# Patient Record
Sex: Female | Born: 1945 | Race: Black or African American | Hispanic: No | Marital: Married | Smoking: Former smoker
Health system: Southern US, Community
[De-identification: ages and names within clinical notes are randomized; demographics above are authoritative.]

## PROBLEM LIST (undated history)

## (undated) DIAGNOSIS — E7800 Pure hypercholesterolemia, unspecified: Secondary | ICD-10-CM

## (undated) DIAGNOSIS — Z972 Presence of dental prosthetic device (complete) (partial): Secondary | ICD-10-CM

## (undated) DIAGNOSIS — K219 Gastro-esophageal reflux disease without esophagitis: Secondary | ICD-10-CM

## (undated) DIAGNOSIS — F419 Anxiety disorder, unspecified: Secondary | ICD-10-CM

## (undated) DIAGNOSIS — D61818 Other pancytopenia: Secondary | ICD-10-CM

## (undated) DIAGNOSIS — M810 Age-related osteoporosis without current pathological fracture: Secondary | ICD-10-CM

## (undated) DIAGNOSIS — D649 Anemia, unspecified: Secondary | ICD-10-CM

## (undated) DIAGNOSIS — I10 Essential (primary) hypertension: Secondary | ICD-10-CM

## (undated) DIAGNOSIS — R011 Cardiac murmur, unspecified: Secondary | ICD-10-CM

## (undated) DIAGNOSIS — C8590 Non-Hodgkin lymphoma, unspecified, unspecified site: Secondary | ICD-10-CM

## (undated) HISTORY — PX: COLONOSCOPY: SHX174

## (undated) HISTORY — PX: FOOT SURGERY: SHX648

## (undated) HISTORY — DX: Other pancytopenia: D61.818

## (undated) HISTORY — PX: ABDOMINAL HYSTERECTOMY: SHX81

---
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
## 2004-06-16 ENCOUNTER — Other Ambulatory Visit: Payer: Self-pay

## 2004-08-07 ENCOUNTER — Emergency Department: Payer: Self-pay | Admitting: Internal Medicine

## 2004-10-15 ENCOUNTER — Ambulatory Visit: Payer: Self-pay | Admitting: Unknown Physician Specialty

## 2004-12-23 ENCOUNTER — Ambulatory Visit: Payer: Self-pay | Admitting: Unknown Physician Specialty

## 2005-01-28 ENCOUNTER — Ambulatory Visit: Payer: Self-pay | Admitting: Unknown Physician Specialty

## 2006-02-28 ENCOUNTER — Emergency Department: Payer: Self-pay | Admitting: General Practice

## 2006-03-12 ENCOUNTER — Emergency Department: Payer: Self-pay | Admitting: Emergency Medicine

## 2006-06-25 ENCOUNTER — Ambulatory Visit: Payer: Self-pay | Admitting: Internal Medicine

## 2008-10-31 ENCOUNTER — Ambulatory Visit: Payer: Self-pay

## 2010-04-03 ENCOUNTER — Ambulatory Visit: Payer: Self-pay | Admitting: Internal Medicine

## 2011-04-22 ENCOUNTER — Ambulatory Visit: Payer: Self-pay | Admitting: Internal Medicine

## 2012-04-29 ENCOUNTER — Ambulatory Visit: Payer: Self-pay | Admitting: Internal Medicine

## 2012-05-05 ENCOUNTER — Ambulatory Visit: Payer: Self-pay | Admitting: Internal Medicine

## 2012-05-05 LAB — CBC CANCER CENTER
Basophil #: 0 x10 3/mm (ref 0.0–0.1)
Basophil %: 0.6 %
Eosinophil #: 0.1 x10 3/mm (ref 0.0–0.7)
Eosinophil %: 4.8 %
Eosinophil: 5 %
HCT: 37.8 % (ref 35.0–47.0)
HGB: 11.9 g/dL — ABNORMAL LOW (ref 12.0–16.0)
Lymphocyte #: 1.1 x10 3/mm (ref 1.0–3.6)
Lymphocyte %: 35 %
Lymphocytes: 30 %
MCH: 26.1 pg (ref 26.0–34.0)
MCHC: 31.5 g/dL — ABNORMAL LOW (ref 32.0–36.0)
MCV: 83 fL (ref 80–100)
Monocyte #: 0.3 x10 3/mm (ref 0.2–0.9)
Monocyte %: 8.9 %
Monocytes: 10 %
Neutrophil #: 1.6 x10 3/mm (ref 1.4–6.5)
Neutrophil %: 50.7 %
Platelet: 131 x10 3/mm — ABNORMAL LOW (ref 150–440)
RBC: 4.56 10*6/uL (ref 3.80–5.20)
RDW: 14.9 % — ABNORMAL HIGH (ref 11.5–14.5)
Segmented Neutrophils: 50 %
Variant Lymphocyte: 5 %
WBC: 3.1 x10 3/mm — ABNORMAL LOW (ref 3.6–11.0)

## 2012-05-05 LAB — RETICULOCYTES
Absolute Retic Count: 0.0728 10*6/uL (ref 0.024–0.084)
Reticulocyte: 1.62 % — ABNORMAL HIGH (ref 0.5–1.5)

## 2012-05-19 LAB — CBC CANCER CENTER
Basophil #: 0 x10 3/mm (ref 0.0–0.1)
Basophil %: 0.8 %
Eosinophil #: 0.2 x10 3/mm (ref 0.0–0.7)
Eosinophil %: 4.9 %
HCT: 37.7 % (ref 35.0–47.0)
HGB: 12.3 g/dL (ref 12.0–16.0)
Lymphocyte #: 1.5 x10 3/mm (ref 1.0–3.6)
Lymphocyte %: 35 %
MCH: 27.1 pg (ref 26.0–34.0)
MCHC: 32.6 g/dL (ref 32.0–36.0)
MCV: 83 fL (ref 80–100)
Monocyte #: 0.4 x10 3/mm (ref 0.2–0.9)
Monocyte %: 9.1 %
Neutrophil #: 2.1 x10 3/mm (ref 1.4–6.5)
Neutrophil %: 50.2 %
Platelet: 154 x10 3/mm (ref 150–440)
RBC: 4.54 10*6/uL (ref 3.80–5.20)
RDW: 14.9 % — ABNORMAL HIGH (ref 11.5–14.5)
WBC: 4.2 x10 3/mm (ref 3.6–11.0)

## 2012-05-19 LAB — IRON AND TIBC
Iron Bind.Cap.(Total): 296 ug/dL (ref 250–450)
Iron Saturation: 28 %
Iron: 84 ug/dL (ref 50–170)
Unbound Iron-Bind.Cap.: 212 ug/dL

## 2012-05-19 LAB — TSH: Thyroid Stimulating Horm: 1.83 u[IU]/mL

## 2012-05-19 LAB — FERRITIN: Ferritin (ARMC): 35 ng/mL (ref 8–388)

## 2012-05-20 ENCOUNTER — Ambulatory Visit: Payer: Self-pay | Admitting: Internal Medicine

## 2012-07-12 ENCOUNTER — Ambulatory Visit: Payer: Self-pay | Admitting: Internal Medicine

## 2012-07-12 LAB — CBC CANCER CENTER
Basophil #: 0 x10 3/mm (ref 0.0–0.1)
Basophil %: 0.7 %
Eosinophil #: 0.2 x10 3/mm (ref 0.0–0.7)
Eosinophil %: 4 %
HCT: 39.5 % (ref 35.0–47.0)
HGB: 12.6 g/dL (ref 12.0–16.0)
Lymphocyte #: 1.4 x10 3/mm (ref 1.0–3.6)
Lymphocyte %: 35.9 %
MCH: 26 pg (ref 26.0–34.0)
MCHC: 31.8 g/dL — ABNORMAL LOW (ref 32.0–36.0)
MCV: 82 fL (ref 80–100)
Monocyte #: 0.4 x10 3/mm (ref 0.2–0.9)
Monocyte %: 10.8 %
Neutrophil #: 1.8 x10 3/mm (ref 1.4–6.5)
Neutrophil %: 48.6 %
Platelet: 139 x10 3/mm — ABNORMAL LOW (ref 150–440)
RBC: 4.82 10*6/uL (ref 3.80–5.20)
RDW: 14.6 % — ABNORMAL HIGH (ref 11.5–14.5)
WBC: 3.8 x10 3/mm (ref 3.6–11.0)

## 2012-07-20 ENCOUNTER — Ambulatory Visit: Payer: Self-pay | Admitting: Internal Medicine

## 2012-08-20 ENCOUNTER — Ambulatory Visit: Payer: Self-pay | Admitting: Internal Medicine

## 2012-08-20 LAB — CBC CANCER CENTER
Basophil #: 0 x10 3/mm (ref 0.0–0.1)
Basophil %: 0.7 %
Basophil: 1 %
Comment - H1-Com1: NORMAL
Eosinophil #: 0.2 x10 3/mm (ref 0.0–0.7)
Eosinophil %: 5.2 %
Eosinophil: 7 %
HCT: 37.4 % (ref 35.0–47.0)
HGB: 11.7 g/dL — ABNORMAL LOW (ref 12.0–16.0)
Lymphocyte #: 1.6 x10 3/mm (ref 1.0–3.6)
Lymphocyte %: 46.6 %
Lymphocytes: 45 %
MCH: 26 pg (ref 26.0–34.0)
MCHC: 31.4 g/dL — ABNORMAL LOW (ref 32.0–36.0)
MCV: 83 fL (ref 80–100)
Monocyte #: 0.3 x10 3/mm (ref 0.2–0.9)
Monocyte %: 9.7 %
Monocytes: 7 %
Neutrophil #: 1.3 x10 3/mm — ABNORMAL LOW (ref 1.4–6.5)
Neutrophil %: 37.8 %
Platelet: 128 x10 3/mm — ABNORMAL LOW (ref 150–440)
RBC: 4.52 10*6/uL (ref 3.80–5.20)
RDW: 14.8 % — ABNORMAL HIGH (ref 11.5–14.5)
Segmented Neutrophils: 40 %
WBC: 3.5 x10 3/mm — ABNORMAL LOW (ref 3.6–11.0)

## 2012-09-19 ENCOUNTER — Ambulatory Visit: Payer: Self-pay | Admitting: Internal Medicine

## 2012-09-30 LAB — CBC CANCER CENTER
Basophil #: 0 x10 3/mm (ref 0.0–0.1)
Basophil %: 0.9 %
Eosinophil #: 0.2 x10 3/mm (ref 0.0–0.7)
Eosinophil %: 4.7 %
HCT: 37.2 % (ref 35.0–47.0)
HGB: 12.2 g/dL (ref 12.0–16.0)
Lymphocyte #: 1.5 x10 3/mm (ref 1.0–3.6)
Lymphocyte %: 37 %
MCH: 26.6 pg (ref 26.0–34.0)
MCHC: 32.8 g/dL (ref 32.0–36.0)
MCV: 81 fL (ref 80–100)
Monocyte #: 0.4 x10 3/mm (ref 0.2–0.9)
Monocyte %: 10.7 %
Neutrophil #: 1.9 x10 3/mm (ref 1.4–6.5)
Neutrophil %: 46.7 %
Platelet: 137 x10 3/mm — ABNORMAL LOW (ref 150–440)
RBC: 4.58 10*6/uL (ref 3.80–5.20)
RDW: 15.1 % — ABNORMAL HIGH (ref 11.5–14.5)
WBC: 4 x10 3/mm (ref 3.6–11.0)

## 2012-10-20 ENCOUNTER — Ambulatory Visit: Payer: Self-pay | Admitting: Internal Medicine

## 2012-10-28 LAB — CBC CANCER CENTER
Basophil #: 0 x10 3/mm (ref 0.0–0.1)
Basophil %: 0.9 %
Eosinophil #: 0.1 x10 3/mm (ref 0.0–0.7)
Eosinophil %: 4.3 %
HCT: 36.6 % (ref 35.0–47.0)
HGB: 12.1 g/dL (ref 12.0–16.0)
Lymphocyte #: 1.4 x10 3/mm (ref 1.0–3.6)
Lymphocyte %: 41.7 %
MCH: 27 pg (ref 26.0–34.0)
MCHC: 33 g/dL (ref 32.0–36.0)
MCV: 82 fL (ref 80–100)
Monocyte #: 0.4 x10 3/mm (ref 0.2–0.9)
Monocyte %: 11 %
Neutrophil #: 1.4 x10 3/mm (ref 1.4–6.5)
Neutrophil %: 42.1 %
Platelet: 135 x10 3/mm — ABNORMAL LOW (ref 150–440)
RBC: 4.49 10*6/uL (ref 3.80–5.20)
RDW: 15.2 % — ABNORMAL HIGH (ref 11.5–14.5)
WBC: 3.4 x10 3/mm — ABNORMAL LOW (ref 3.6–11.0)

## 2012-11-20 ENCOUNTER — Ambulatory Visit: Payer: Self-pay | Admitting: Internal Medicine

## 2012-12-18 ENCOUNTER — Ambulatory Visit: Payer: Self-pay | Admitting: Internal Medicine

## 2013-02-25 ENCOUNTER — Ambulatory Visit: Payer: Self-pay | Admitting: Internal Medicine

## 2013-03-27 ENCOUNTER — Emergency Department: Payer: Self-pay | Admitting: Emergency Medicine

## 2013-06-15 ENCOUNTER — Ambulatory Visit: Payer: Self-pay | Admitting: Internal Medicine

## 2015-05-21 ENCOUNTER — Other Ambulatory Visit: Payer: Self-pay | Admitting: Internal Medicine

## 2015-05-21 DIAGNOSIS — Z1231 Encounter for screening mammogram for malignant neoplasm of breast: Secondary | ICD-10-CM

## 2015-05-22 ENCOUNTER — Ambulatory Visit
Admission: RE | Admit: 2015-05-22 | Discharge: 2015-05-22 | Disposition: A | Discharge status: Home / Self Care | Payer: Medicare PPO | Source: Ambulatory Visit | Attending: Internal Medicine | Admitting: Internal Medicine

## 2015-05-22 ENCOUNTER — Other Ambulatory Visit: Payer: Self-pay | Admitting: Internal Medicine

## 2015-05-22 DIAGNOSIS — Z1231 Encounter for screening mammogram for malignant neoplasm of breast: Secondary | ICD-10-CM

## 2016-06-03 ENCOUNTER — Other Ambulatory Visit: Payer: Self-pay | Admitting: Internal Medicine

## 2016-06-03 DIAGNOSIS — Z1231 Encounter for screening mammogram for malignant neoplasm of breast: Secondary | ICD-10-CM

## 2016-06-20 ENCOUNTER — Other Ambulatory Visit: Payer: Self-pay | Admitting: Internal Medicine

## 2016-06-20 ENCOUNTER — Ambulatory Visit
Admission: RE | Admit: 2016-06-20 | Discharge: 2016-06-20 | Disposition: A | Discharge status: Home / Self Care | Payer: Medicare Other | Source: Ambulatory Visit | Attending: Internal Medicine | Admitting: Internal Medicine

## 2016-06-20 DIAGNOSIS — Z1231 Encounter for screening mammogram for malignant neoplasm of breast: Secondary | ICD-10-CM

## 2016-06-22 ENCOUNTER — Emergency Department
Admission: EM | Admit: 2016-06-22 | Discharge: 2016-06-22 | Disposition: A | Discharge status: Home / Self Care | Payer: Medicare Other | Attending: Emergency Medicine | Admitting: Emergency Medicine

## 2016-06-22 ENCOUNTER — Emergency Department: Payer: Medicare Other

## 2016-06-22 ENCOUNTER — Encounter: Payer: Self-pay | Admitting: Emergency Medicine

## 2016-06-22 DIAGNOSIS — M25519 Pain in unspecified shoulder: Secondary | ICD-10-CM | POA: Insufficient documentation

## 2016-06-22 DIAGNOSIS — Z87891 Personal history of nicotine dependence: Secondary | ICD-10-CM | POA: Diagnosis not present

## 2016-06-22 DIAGNOSIS — R079 Chest pain, unspecified: Secondary | ICD-10-CM | POA: Diagnosis not present

## 2016-06-22 DIAGNOSIS — R0789 Other chest pain: Secondary | ICD-10-CM | POA: Diagnosis present

## 2016-06-22 DIAGNOSIS — I10 Essential (primary) hypertension: Secondary | ICD-10-CM | POA: Insufficient documentation

## 2016-06-22 HISTORY — DX: Essential (primary) hypertension: I10

## 2016-06-22 LAB — CBC
HCT: 36.9 % (ref 35.0–47.0)
Hemoglobin: 12.4 g/dL (ref 12.0–16.0)
MCH: 26.7 pg (ref 26.0–34.0)
MCHC: 33.5 g/dL (ref 32.0–36.0)
MCV: 79.6 fL — ABNORMAL LOW (ref 80.0–100.0)
Platelets: 147 10*3/uL — ABNORMAL LOW (ref 150–440)
RBC: 4.63 MIL/uL (ref 3.80–5.20)
RDW: 16.1 % — ABNORMAL HIGH (ref 11.5–14.5)
WBC: 4.4 10*3/uL (ref 3.6–11.0)

## 2016-06-22 LAB — BASIC METABOLIC PANEL
Anion gap: 7 (ref 5–15)
BUN: 17 mg/dL (ref 6–20)
CO2: 25 mmol/L (ref 22–32)
Calcium: 9 mg/dL (ref 8.9–10.3)
Chloride: 108 mmol/L (ref 101–111)
Creatinine, Ser: 0.74 mg/dL (ref 0.44–1.00)
GFR calc Af Amer: >60 mL/min (ref >60)
GFR calc non Af Amer: >60 mL/min (ref >60)
Glucose, Bld: 121 mg/dL — ABNORMAL HIGH (ref 65–99)
Potassium: 3.4 mmol/L — ABNORMAL LOW (ref 3.5–5.1)
Sodium: 140 mmol/L (ref 135–145)

## 2016-06-22 LAB — TROPONIN I
Troponin I: <0.03 ng/mL (ref <0.03)
Troponin I: <0.03 ng/mL (ref <0.03)

## 2016-06-22 MED ORDER — HYDROCODONE-ACETAMINOPHEN 5-325 MG PO TABS
2.0000 | ORAL_TABLET | Freq: Once | ORAL | Status: AC
Start: 2016-06-22 — End: 2016-06-22
  Administered 2016-06-22: 2 via ORAL
  Filled 2016-06-22: qty 2

## 2016-06-22 MED ORDER — DOCUSATE SODIUM 100 MG PO CAPS: ORAL_CAPSULE | ORAL | 0 refills | Status: AC

## 2016-06-22 MED ORDER — ASPIRIN 81 MG PO CHEW
324.0000 mg | CHEWABLE_TABLET | Freq: Once | ORAL | Status: AC
  Administered 2016-06-22: 324 mg via ORAL
  Filled 2016-06-22: qty 4

## 2016-06-22 MED ORDER — HYDROCODONE-ACETAMINOPHEN 5-325 MG PO TABS: 1.0000 | ORAL_TABLET | ORAL | 0 refills | Status: DC | PRN

## 2016-06-22 NOTE — ED Triage Notes (Signed)
Pt states she woke up with chest pain this morning. Pt states she went to church and the pain continued. Pt states it feels like indigestion.  Pt denies hx of MI.  Pt is not currently on any heart burn medication. Pt denies any N/V, sob, or diaphoresis.  Pt ambulatory in triage without difficulty.

## 2016-06-22 NOTE — Discharge Instructions (Signed)

## 2016-06-22 NOTE — ED Provider Notes (Signed)
[facility] Emergency Department Provider Note  ____________________________________________   First MD Initiated Contact with Patient 06/22/16 1846     (approximate)  I have reviewed the triage vital signs and the nursing notes.   HISTORY  Chief Complaint Chest Pain    HPI Mercedes Vasquez is a 70 y.o. female who is generally healthy except for some hypertension and who goes to Dr. Wolfgang Phoenix for her primary care presents for evaluation of chest pain and shoulder pain.  She states that her shoulders have been bothering her for "a while now" and that she has some pain pills at home but has been trying not to take him.  Earlier today, however, she started developing some central chest pain and pressure that is mild but was bothering her.  Nothing in particular made it better nor worse.  She denies diaphoresis, nausea, vomiting, abdominal pain, dysuria, headache.  She states that the pain in her shoulders may be causing the pain in her chest but she is not sure and that has not happened before.  She has not smoked for more than 30 years.  She states that she recently had a cardiac workup ordered by Dr. Humphrey Rolls including what she thinks was a cardiac CT and they told her that everything was good.   Past Medical History:  Diagnosis Date  . Hypertension     There are no active problems to display for this patient.   History reviewed. No pertinent surgical history.  Prior to Admission medications   Medication Sig Start Date End Date Taking? Authorizing Provider  docusate sodium (COLACE) 100 MG capsule Take 1 tablet once or twice daily as needed for constipation while taking narcotic pain medicine 06/22/16   Hinda Kehr, MD  HYDROcodone-acetaminophen (NORCO/VICODIN) 5-325 MG tablet Take 1-2 tablets by mouth every 4 (four) hours as needed for moderate pain. 06/22/16   Hinda Kehr, MD    Allergies Review of patient's allergies indicates no known allergies.  Family  History  Problem Relation Age of Onset  . Breast cancer Neg Hx     Social History Social History  Substance Use Topics  . Smoking status: Former Research scientist (life sciences)  . Smokeless tobacco: Never Used  . Alcohol use No    Review of Systems Constitutional: No fever/chills Eyes: No visual changes. ENT: No sore throat. Cardiovascular: +chest pain. Respiratory: Denies shortness of breath. Gastrointestinal: No abdominal pain.  No nausea, no vomiting.  No diarrhea.  No constipation. Genitourinary: Negative for dysuria. Musculoskeletal: Negative for back pain. +shoulder pain (chronic) Skin: Negative for rash. Neurological: Negative for headaches, focal weakness or numbness.  10-point ROS otherwise negative.  ____________________________________________   PHYSICAL EXAM:  VITAL SIGNS: ED Triage Vitals  Enc Vitals Group     BP 06/22/16 1704 129/60     Pulse Rate 06/22/16 1704 60     Resp 06/22/16 1704 (!) 2     Temp 06/22/16 1704 99.4 F (37.4 C)     Temp Source 06/22/16 1704 Oral     SpO2 06/22/16 1704 100 %     Weight 06/22/16 1701 180 lb (81.6 kg)     Height 06/22/16 1701 5\' 4"  (1.626 m)     Head Circumference --      Peak Flow --      Pain Score 06/22/16 1701 7     Pain Loc --      Pain Edu? --      Excl. in [place]? --  Constitutional: Alert and oriented. Well appearing and in no acute distress. Eyes: Conjunctivae are normal. PERRL. EOMI. Head: Atraumatic. Nose: No congestion/rhinnorhea. Mouth/Throat: Mucous membranes are moist.  Oropharynx non-erythematous. Neck: No stridor.  No meningeal signs.  No cervical spine tenderness to palpation. Cardiovascular: Normal rate, regular rhythm. Good peripheral circulation. Grossly normal heart sounds. No reproducible chest wall tenderness Respiratory: Normal respiratory effort.  No retractions. Lungs CTAB. Gastrointestinal: Soft and nontender. No distention.  Musculoskeletal: No lower extremity tenderness nor edema. No gross deformities of  extremities. Neurologic:  Normal speech and language. No gross focal neurologic deficits are appreciated.  Skin:  Skin is warm, dry and intact. No rash noted. Psychiatric: Mood and affect are normal. Speech and behavior are normal.  ____________________________________________   LABS (all labs ordered are listed, but only abnormal results are displayed)  Labs Reviewed  BASIC METABOLIC PANEL - Abnormal; Notable for the following:       Result Value   Potassium 3.4 (*)    Glucose, Bld 121 (*)    All other components within normal limits  CBC - Abnormal; Notable for the following:    MCV 79.6 (*)    RDW 16.1 (*)    Platelets 147 (*)    All other components within normal limits  TROPONIN I  TROPONIN I   ____________________________________________  EKG  ED ECG REPORT I, Rubee Vega, the attending physician, personally viewed and interpreted this ECG.  Date: 06/22/2016 EKG Time: 16:58 Rate: 60 Rhythm: normal sinus rhythm QRS Axis: normal Intervals: normal ST/T Wave abnormalities: normal Conduction Disturbances: none Narrative Interpretation: unremarkable  ____________________________________________  RADIOLOGY   Dg Chest 2 View  Result Date: 06/22/2016 CLINICAL DATA:  Chest and epigastric pain beginning this morning. EXAM: CHEST  2 VIEW COMPARISON:  03/27/2013 FINDINGS: Borderline cardiomegaly is stable as well as ectasia of the thoracic aorta. Both lungs are clear. No evidence of pneumothorax or pleural effusion. IMPRESSION: Stable exam.  No active cardiopulmonary disease. Electronically Signed   By: Earle Gell M.D.   On: 06/22/2016 17:39    ____________________________________________   PROCEDURES  Procedure(s) performed:   Procedures   Critical Care performed: No ____________________________________________   INITIAL IMPRESSION / ASSESSMENT AND PLAN / ED COURSE  Pertinent labs & imaging results that were available during my care of the patient were  reviewed by me and considered in my medical decision making (see chart for details).  HEART score 3, Wells Score of PE is zero.  Gave full dose ASA.  Second troponin was drawn early, but was normal, and the patient is eager to go home.  Given her low risk for cardiac issues at that is appropriate.  I encouraged her to follow up with Dr. Humphrey Rolls at the next available opportunity and continue taking a low-dose baby aspirin.   ____________________________________________  FINAL CLINICAL IMPRESSION(S) / ED DIAGNOSES  Final diagnoses:  Nonspecific chest pain     MEDICATIONS GIVEN DURING THIS VISIT:  Medications  aspirin chewable tablet 324 mg (324 mg Oral Given 06/22/16 1913)  HYDROcodone-acetaminophen (NORCO/VICODIN) 5-325 MG per tablet 2 tablet (2 tablets Oral Given 06/22/16 1914)     NEW OUTPATIENT MEDICATIONS STARTED DURING THIS VISIT:  New Prescriptions   DOCUSATE SODIUM (COLACE) 100 MG CAPSULE    Take 1 tablet once or twice daily as needed for constipation while taking narcotic pain medicine   HYDROCODONE-ACETAMINOPHEN (NORCO/VICODIN) 5-325 MG TABLET    Take 1-2 tablets by mouth every 4 (four) hours as needed for moderate pain.  Modified Medications   No medications on file    Discontinued Medications   No medications on file     Note:  This document was prepared using Dragon voice recognition software and may include unintentional dictation errors.    Hinda Kehr, MD 06/22/16 2009

## 2016-06-22 NOTE — ED Notes (Signed)
NAD noted at time of D/C. Pt ambulatory to the lobby without difficulty. Pt denies any comments/concerns regarding D/C at this time.

## 2016-10-27 ENCOUNTER — Encounter: Payer: Self-pay | Admitting: *Deleted

## 2016-10-28 NOTE — H&P (Signed)
See scanned note.

## 2016-10-29 ENCOUNTER — Encounter: Payer: Self-pay | Admitting: *Deleted

## 2016-10-29 ENCOUNTER — Ambulatory Visit: Payer: Medicare Other | Admitting: Anesthesiology

## 2016-10-29 ENCOUNTER — Ambulatory Visit
Admission: RE | Admit: 2016-10-29 | Discharge: 2016-10-29 | Disposition: A | Discharge status: Home / Self Care | Payer: Medicare Other | Source: Ambulatory Visit | Attending: Ophthalmology | Admitting: Ophthalmology

## 2016-10-29 ENCOUNTER — Encounter
Admission: RE | Disposition: A | Discharge status: Home / Self Care | Payer: Self-pay | Source: Ambulatory Visit | Attending: Ophthalmology

## 2016-10-29 DIAGNOSIS — K219 Gastro-esophageal reflux disease without esophagitis: Secondary | ICD-10-CM | POA: Insufficient documentation

## 2016-10-29 DIAGNOSIS — Z87891 Personal history of nicotine dependence: Secondary | ICD-10-CM | POA: Insufficient documentation

## 2016-10-29 DIAGNOSIS — I10 Essential (primary) hypertension: Secondary | ICD-10-CM | POA: Insufficient documentation

## 2016-10-29 DIAGNOSIS — H2511 Age-related nuclear cataract, right eye: Secondary | ICD-10-CM | POA: Diagnosis not present

## 2016-10-29 DIAGNOSIS — F419 Anxiety disorder, unspecified: Secondary | ICD-10-CM | POA: Diagnosis not present

## 2016-10-29 DIAGNOSIS — D649 Anemia, unspecified: Secondary | ICD-10-CM | POA: Diagnosis not present

## 2016-10-29 HISTORY — DX: Cardiac murmur, unspecified: R01.1

## 2016-10-29 HISTORY — DX: Anemia, unspecified: D64.9

## 2016-10-29 HISTORY — PX: CATARACT EXTRACTION W/PHACO: SHX586

## 2016-10-29 HISTORY — DX: Anxiety disorder, unspecified: F41.9

## 2016-10-29 HISTORY — DX: Gastro-esophageal reflux disease without esophagitis: K21.9

## 2016-10-29 SURGERY — PHACOEMULSIFICATION, CATARACT, WITH IOL INSERTION
Anesthesia: Monitor Anesthesia Care | Site: Eye | Laterality: Right | Wound class: Clean

## 2016-10-29 MED ORDER — POVIDONE-IODINE 5 % OP SOLN
OPHTHALMIC | Status: AC
  Filled 2016-10-29: qty 30

## 2016-10-29 MED ORDER — MIDAZOLAM HCL 2 MG/2ML IJ SOLN
INTRAMUSCULAR | Status: AC
  Filled 2016-10-29: qty 2

## 2016-10-29 MED ORDER — TETRACAINE HCL 0.5 % OP SOLN
OPHTHALMIC | Status: AC
  Filled 2016-10-29: qty 2

## 2016-10-29 MED ORDER — MOXIFLOXACIN HCL 0.5 % OP SOLN
1.0000 [drp] | OPHTHALMIC | Status: AC
  Administered 2016-10-29 (×3): 1 [drp] via OPHTHALMIC

## 2016-10-29 MED ORDER — MIDAZOLAM HCL 2 MG/2ML IJ SOLN
INTRAMUSCULAR | Status: DC | PRN
  Administered 2016-10-29 (×2): 0.5 mg via INTRAVENOUS

## 2016-10-29 MED ORDER — LIDOCAINE HCL (PF) 4 % IJ SOLN
INTRAMUSCULAR | Status: AC
  Filled 2016-10-29: qty 5

## 2016-10-29 MED ORDER — LIDOCAINE HCL (PF) 4 % IJ SOLN
INTRAMUSCULAR | Status: DC | PRN
  Administered 2016-10-29: 4 mL via OPHTHALMIC

## 2016-10-29 MED ORDER — NA CHONDROIT SULF-NA HYALURON 40-17 MG/ML IO SOLN
INTRAOCULAR | Status: AC
  Filled 2016-10-29: qty 1

## 2016-10-29 MED ORDER — PHENYLEPHRINE HCL 10 % OP SOLN
1.0000 [drp] | OPHTHALMIC | Status: DC
  Administered 2016-10-29 (×4): 1 [drp] via OPHTHALMIC

## 2016-10-29 MED ORDER — SODIUM CHLORIDE 0.9 % IV SOLN
INTRAVENOUS | Status: DC
  Administered 2016-10-29 (×2): via INTRAVENOUS

## 2016-10-29 MED ORDER — TETRACAINE HCL 0.5 % OP SOLN
OPHTHALMIC | Status: DC | PRN
  Administered 2016-10-29: 1 [drp] via OPHTHALMIC

## 2016-10-29 MED ORDER — CEFUROXIME OPHTHALMIC INJECTION 1 MG/0.1 ML
INJECTION | OPHTHALMIC | Status: DC | PRN
  Administered 2016-10-29: 0.1 mL via INTRACAMERAL

## 2016-10-29 MED ORDER — POVIDONE-IODINE 5 % OP SOLN
OPHTHALMIC | Status: DC | PRN
  Administered 2016-10-29: 1 via OPHTHALMIC

## 2016-10-29 MED ORDER — EPINEPHRINE PF 1 MG/ML IJ SOLN
INTRAOCULAR | Status: DC | PRN
  Administered 2016-10-29: 1 mL via OPHTHALMIC

## 2016-10-29 MED ORDER — BUPIVACAINE HCL (PF) 0.75 % IJ SOLN
INTRAMUSCULAR | Status: AC
  Filled 2016-10-29: qty 10

## 2016-10-29 MED ORDER — LIDOCAINE HCL (PF) 4 % IJ SOLN
INTRAMUSCULAR | Status: DC | PRN
  Administered 2016-10-29: 2.25 mL via OPHTHALMIC

## 2016-10-29 MED ORDER — CEFUROXIME OPHTHALMIC INJECTION 1 MG/0.1 ML
INJECTION | OPHTHALMIC | Status: AC
  Filled 2016-10-29: qty 0.1

## 2016-10-29 MED ORDER — MOXIFLOXACIN HCL 0.5 % OP SOLN
OPHTHALMIC | Status: AC
  Administered 2016-10-29: 1 [drp] via OPHTHALMIC
  Filled 2016-10-29: qty 3

## 2016-10-29 MED ORDER — HYALURONIDASE HUMAN 150 UNIT/ML IJ SOLN
INTRAMUSCULAR | Status: AC
  Filled 2016-10-29: qty 1

## 2016-10-29 MED ORDER — EPINEPHRINE PF 1 MG/ML IJ SOLN
INTRAMUSCULAR | Status: AC
  Filled 2016-10-29: qty 1

## 2016-10-29 MED ORDER — CARBACHOL 0.01 % IO SOLN
INTRAOCULAR | Status: DC | PRN
Start: 2016-10-29 — End: 2016-10-29
  Administered 2016-10-29: 0.5 mL via INTRAOCULAR

## 2016-10-29 MED ORDER — PHENYLEPHRINE HCL 10 % OP SOLN
OPHTHALMIC | Status: AC
  Administered 2016-10-29: 1 [drp] via OPHTHALMIC
  Filled 2016-10-29: qty 5

## 2016-10-29 MED ORDER — ALFENTANIL 500 MCG/ML IJ INJ
INJECTION | INTRAMUSCULAR | Status: DC | PRN
  Administered 2016-10-29: 500 ug via INTRAVENOUS

## 2016-10-29 MED ORDER — MOXIFLOXACIN HCL 0.5 % OP SOLN
OPHTHALMIC | Status: DC | PRN
  Administered 2016-10-29: 1 [drp] via OPHTHALMIC

## 2016-10-29 MED ORDER — CYCLOPENTOLATE HCL 2 % OP SOLN
1.0000 [drp] | OPHTHALMIC | Status: AC
  Administered 2016-10-29 (×4): 1 [drp] via OPHTHALMIC

## 2016-10-29 MED ORDER — CYCLOPENTOLATE HCL 2 % OP SOLN
OPHTHALMIC | Status: AC
  Administered 2016-10-29: 1 [drp] via OPHTHALMIC
  Filled 2016-10-29: qty 2

## 2016-10-29 MED ORDER — NA CHONDROIT SULF-NA HYALURON 40-17 MG/ML IO SOLN
INTRAOCULAR | Status: DC | PRN
  Administered 2016-10-29: 1 mL via INTRAOCULAR

## 2016-10-29 SURGICAL SUPPLY — 29 items

## 2016-10-29 NOTE — Interval H&P Note (Signed)
History and Physical Interval Note:  10/29/2016 10:07 AM  Mercedes Vasquez  has presented today for surgery, with the diagnosis of cataract  The various methods of treatment have been discussed with the patient and family. After consideration of risks, benefits and other options for treatment, the patient has consented to  Procedure(s): CATARACT EXTRACTION PHACO AND INTRAOCULAR LENS PLACEMENT ([place]) (Right) as a surgical intervention .  The patient's history has been reviewed, patient examined, no change in status, stable for surgery.  I have reviewed the patient's chart and labs.  Questions were answered to the patient's satisfaction.     Mercedes Vasquez

## 2016-10-29 NOTE — Anesthesia Preprocedure Evaluation (Signed)
Anesthesia Evaluation  Patient identified by MRN, date of birth, ID band Patient awake    Reviewed: Allergy & Precautions, NPO status , Patient's Chart, lab work & pertinent test results  History of Anesthesia Complications Negative for: history of anesthetic complications  Airway Mallampati: II  TM Distance: >3 FB Neck ROM: Full    Dental  (+) Lower Dentures   Pulmonary neg sleep apnea, neg COPD, former smoker,    breath sounds clear to auscultation- rhonchi (-) wheezing      Cardiovascular Exercise Tolerance: Good hypertension, (-) CAD and (-) Past MI  Rhythm:Regular Rate:Normal - Systolic murmurs and - Diastolic murmurs    Neuro/Psych Anxiety    GI/Hepatic Neg liver ROS, GERD  ,  Endo/Other  negative endocrine ROSneg diabetes  Renal/GU negative Renal ROS     Musculoskeletal negative musculoskeletal ROS (+)   Abdominal (+) + obese,   Peds  Hematology  (+) anemia ,   Anesthesia Other Findings Past Medical History: No date: Anemia No date: Anxiety No date: GERD (gastroesophageal reflux disease) No date: Heart murmur No date: Hypertension   Reproductive/Obstetrics                             Anesthesia Physical Anesthesia Plan  ASA: II  Anesthesia Plan: MAC   Post-op Pain Management:    Induction: Intravenous  Airway Management Planned: Natural Airway  Additional Equipment:   Intra-op Plan:   Post-operative Plan:   Informed Consent: I have reviewed the patients History and Physical, chart, labs and discussed the procedure including the risks, benefits and alternatives for the proposed anesthesia with the patient or authorized representative who has indicated his/her understanding and acceptance.     Plan Discussed with: CRNA and Anesthesiologist  Anesthesia Plan Comments:         Anesthesia Quick Evaluation

## 2016-10-29 NOTE — Anesthesia Postprocedure Evaluation (Signed)
Anesthesia Post Note  Patient: Mercedes Vasquez  Procedure(s) Performed: Procedure(s) (LRB): CATARACT EXTRACTION PHACO AND INTRAOCULAR LENS PLACEMENT ([facility]) (Right)  Patient location during evaluation: PACU Anesthesia Type: MAC Level of consciousness: awake and alert and oriented Pain management: pain level controlled Vital Signs Assessment: post-procedure vital signs reviewed and stable Respiratory status: spontaneous breathing, nonlabored ventilation, respiratory function stable and patient connected to nasal cannula oxygen Cardiovascular status: stable and blood pressure returned to baseline Anesthetic complications: no     Last Vitals:  Vitals:   10/29/16 1059 10/29/16 1102  BP: (!) 159/75 (!) 155/73  Pulse: (!) 53   Resp: 16   Temp: 36.4 C     Last Pain:  Vitals:   10/29/16 0826  TempSrc: Oral                 Jatziry Wechter

## 2016-10-29 NOTE — Transfer of Care (Signed)
Immediate Anesthesia Transfer of Care Note  Patient: Mercedes Vasquez  Procedure(s) Performed: Procedure(s) with comments: CATARACT EXTRACTION PHACO AND INTRAOCULAR LENS PLACEMENT ([facility]) (Right) - [place]  01:04 AP% 24.1 CDE 28.71 fluid pack lot # [id number]  Patient Location: PACU and Short Stay  Anesthesia Type:MAC  Level of Consciousness: awake, oriented and patient cooperative  Airway & Oxygen Therapy: Patient Spontanous Breathing  Post-op Assessment: Report given to RN and Post -op Vital signs reviewed and stable  Post vital signs: Reviewed and stable  Last Vitals:  Vitals:   10/29/16 0826  BP: (!) 143/75  Pulse: (!) 55  Resp: 18  Temp: 37 C    Last Pain:  Vitals:   10/29/16 0826  TempSrc: Oral         Complications: No apparent anesthesia complications

## 2016-10-29 NOTE — Discharge Instructions (Signed)
Eye Surgery Discharge Instructions  Expect mild scratchy sensation or mild soreness. DO NOT RUB YOUR EYE!  The day of surgery:  Minimal physical activity, but bed rest is not required  No reading, computer work, or close hand work  No bending, lifting, or straining.  May watch TV  For 24 hours:  No driving, legal decisions, or alcoholic beverages  Safety precautions  Eat anything you prefer: It is better to start with liquids, then soup then solid foods.  _____ Eye patch should be worn until postoperative exam tomorrow.  ____ Solar shield eyeglasses should be worn for comfort in the sunlight/patch while sleeping  Resume all regular medications including aspirin or Coumadin if these were discontinued prior to surgery. You may shower, bathe, shave, or wash your hair. Tylenol may be taken for mild discomfort.  Call your doctor if you experience significant pain, nausea, or vomiting, fever > 101 or other signs of infection. [phone] or [phone] Specific instructions:  Follow-up Information    Dannae Kato, MD Follow up.   Specialty:  Ophthalmology Why:  January 11 at 10:45am Contact information: [address]   [place] [address] [phone]

## 2016-10-29 NOTE — Op Note (Signed)
Date of Surgery: 10/29/2016 Date of Dictation: 10/29/2016 10:56 AM Pre-operative Diagnosis:  Nuclear Sclerotic Cataract right Eye Post-operative Diagnosis: same Procedure performed: Extra-capsular Cataract Extraction (ECCE) with placement of a posterior chamber intraocular lens (IOL) right Eye IOL:  Implant Name Type Inv. Item Serial No. Manufacturer Lot No. LRB No. Used  LENS IOL ACRYSOF IQ 22.5 - [id number] Intraocular Lens LENS IOL ACRYSOF IQ 22.5 [id number] ALCON   Right 1   Anesthesia: 2% Lidocaine and 4% Marcaine in a 50/50 mixture with 10 unites/ml of Hylenex given as a peribulbar Anesthesiologist: Anesthesiologist: Emmie Niemann, MD CRNA: Courtney Paris, CRNA Complications: none Estimated Blood Loss: less than 1 ml  Description of procedure:  The patient was given anesthesia and sedation via intravenous access. The patient was then prepped and draped in the usual fashion. A 25-gauge needle was bent for initiating the capsulorhexis. A 5-0 silk suture was placed through the conjunctiva superior and inferiorly to serve as bridle sutures. Hemostasis was obtained at the superior limbus using an eraser cautery. A partial thickness groove was made at the anterior surgical limbus with a 64 Beaver blade and this was dissected anteriorly with an [employer]. The anterior chamber was entered at 10 o'clock with a 1.0 mm paracentesis knife and through the lamellar dissection with a 2.6 mm Alcon keratome. Epi-Shugarcaine 0.5 CC [9 cc BSS Plus (Alcon), 3 cc 4% preservative-free lidocaine (Hospira) and 4 cc 1:1000 preservative-free, bisulfite-free epinephrine] was injected into the anterior chamber via the paracentesis tract. Epi-Shugarcaine 0.5 CC [9 cc BSS Plus (Alcon), 3 cc 4% preservative-free lidocaine (Hospira) and 4 cc 1:1000 preservative-free, bisulfite-free epinephrine] was injected into the anterior chamber via the paracentesis tract. DiscoVisc was injected to replace the aqueous and a  continuous tear curvilinear capsulorhexis was performed using a bent 25-gauge needle.  Balance salt on a syringe was used to perform hydro-dissection and phacoemulsification was carried out using a divide and conquer technique. Procedure(s) with comments: CATARACT EXTRACTION PHACO AND INTRAOCULAR LENS PLACEMENT ([facility]) (Right) - [place]  01:04 AP% 24.1 CDE 28.71 fluid pack lot # [id number]. Irrigation/aspiration was used to remove the residual cortex and the capsular bag was inflated with DiscoVisc. The intraocular lens was inserted into the capsular bag using a pre-loaded UltraSert Delivery System. Irrigation/aspiration was used to remove the residual DiscoVisc. The wound was inflated with balanced salt and checked for leaks. None were found. Miostat was injected via the paracentesis track and 0.1 ml of cefuroxime containing 1 mg of drug  was injected via the paracentesis track. The wound was checked for leaks again and none were found.   The bridal sutures were removed and two drops of Vigamox were placed on the eye. An eye shield was placed to protect the eye and the patient was discharged to the recovery area in good condition.   Fleur Audino MD

## 2017-12-15 ENCOUNTER — Other Ambulatory Visit: Payer: Self-pay | Admitting: Internal Medicine

## 2017-12-15 DIAGNOSIS — Z1231 Encounter for screening mammogram for malignant neoplasm of breast: Secondary | ICD-10-CM

## 2017-12-28 ENCOUNTER — Other Ambulatory Visit: Payer: Self-pay

## 2017-12-28 ENCOUNTER — Emergency Department: Payer: Medicare HMO

## 2017-12-28 DIAGNOSIS — Z87891 Personal history of nicotine dependence: Secondary | ICD-10-CM | POA: Insufficient documentation

## 2017-12-28 DIAGNOSIS — R0789 Other chest pain: Secondary | ICD-10-CM | POA: Insufficient documentation

## 2017-12-28 DIAGNOSIS — Z79899 Other long term (current) drug therapy: Secondary | ICD-10-CM | POA: Diagnosis not present

## 2017-12-28 DIAGNOSIS — I10 Essential (primary) hypertension: Secondary | ICD-10-CM | POA: Insufficient documentation

## 2017-12-28 LAB — BASIC METABOLIC PANEL
Anion gap: 7 (ref 5–15)
BUN: 13 mg/dL (ref 6–20)
CO2: 27 mmol/L (ref 22–32)
Calcium: 9.5 mg/dL (ref 8.9–10.3)
Chloride: 105 mmol/L (ref 101–111)
Creatinine, Ser: 0.71 mg/dL (ref 0.44–1.00)
GFR calc Af Amer: >60 mL/min (ref >60)
GFR calc non Af Amer: >60 mL/min (ref >60)
Glucose, Bld: 118 mg/dL — ABNORMAL HIGH (ref 65–99)
Potassium: 3.1 mmol/L — ABNORMAL LOW (ref 3.5–5.1)
Sodium: 139 mmol/L (ref 135–145)

## 2017-12-28 LAB — CBC
HCT: 38.3 % (ref 35.0–47.0)
Hemoglobin: 12.7 g/dL (ref 12.0–16.0)
MCH: 26.4 pg (ref 26.0–34.0)
MCHC: 33.3 g/dL (ref 32.0–36.0)
MCV: 79.3 fL — ABNORMAL LOW (ref 80.0–100.0)
Platelets: 169 10*3/uL (ref 150–440)
RBC: 4.83 MIL/uL (ref 3.80–5.20)
RDW: 15.5 % — ABNORMAL HIGH (ref 11.5–14.5)
WBC: 4.7 10*3/uL (ref 3.6–11.0)

## 2017-12-28 LAB — TROPONIN I: Troponin I: <0.03 ng/mL (ref <0.03)

## 2017-12-28 NOTE — ED Triage Notes (Signed)
Pt arrives to ED via POV from home with c/o CP x1 day. Pt states is "not like a hurting pain, but more like a stinging pain". Pt denies any c/o N/V/D, no c/o ABD pain or SHOB. No c/o lightheadedness, dizziness or weakness. Pt reports centralized CP without radiation. Pt denies knowing of anything makes the pain better or worse; no OTC meds taken PTA; no previous cardiac h/x.

## 2017-12-29 ENCOUNTER — Emergency Department
Admission: EM | Admit: 2017-12-29 | Discharge: 2017-12-29 | Disposition: A | Discharge status: Home / Self Care | Payer: Medicare HMO | Attending: Emergency Medicine | Admitting: Emergency Medicine

## 2017-12-29 DIAGNOSIS — R079 Chest pain, unspecified: Secondary | ICD-10-CM

## 2017-12-29 LAB — TROPONIN I: Troponin I: <0.03 ng/mL (ref <0.03)

## 2017-12-29 NOTE — ED Provider Notes (Signed)
[facility] Emergency Department Provider Note   ____________________________________________   First MD Initiated Contact with Patient 12/29/17 0012     (approximate)  I have reviewed the triage vital signs and the nursing notes.   HISTORY  Chief Complaint Chest Pain    HPI Mercedes Vasquez is a 72 y.o. female who comes into the hospital today with some intermittent chest pain.  She states that she has had some sharp tingles in her chest on and off all day.  The patient states that it stinging in the middle of her chest.  She told this to her daughters and they told her to come and get checked out.  The patient denies any pain at this time.  She reports that she has had this in the past before but has never had it checked out.  The patient denies any shortness of breath nausea vomiting or sweats.  The patient states that nothing makes his symptoms better or worse and nothing seems to bring it on.  The patient denies any radiation of her chest pain.  The patient decided to come and get checked out.  Past Medical History:  Diagnosis Date  . Anemia   . Anxiety   . GERD (gastroesophageal reflux disease)   . Heart murmur   . Hypertension     There are no active problems to display for this patient.   Past Surgical History:  Procedure Laterality Date  . ABDOMINAL HYSTERECTOMY    . CATARACT EXTRACTION W/PHACO Right 10/29/2016   Procedure: CATARACT EXTRACTION PHACO AND INTRAOCULAR LENS PLACEMENT (IOC);  Surgeon: Estill Cotta, MD;  Location: [facility] ORS;  Service: Ophthalmology;  Laterality: Right;  [place]  01:04 AP% 24.1 CDE 28.71 fluid pack lot # [id number]  . COLONOSCOPY    . FOOT SURGERY      Prior to Admission medications   Medication Sig Start Date End Date Taking? Authorizing Provider  amLODipine (NORVASC) 5 MG tablet Take 5 mg by mouth daily.    [provider]  atorvastatin (LIPITOR) 20 MG tablet Take 20 mg by mouth daily.    [provider]  citalopram (CELEXA) 20 MG tablet Take 20 mg by mouth daily.    [provider]  docusate sodium (COLACE) 100 MG capsule Take 1 tablet once or twice daily as needed for constipation while taking narcotic pain medicine Patient taking differently: Take 100 mg by mouth daily as needed (constipation).  06/22/16   Hinda Kehr, MD  Ferrous Fumarate-Folic Acid (HEMATINIC/FOLIC ACID PO) Take 1 tablet by mouth daily.    [provider]  fluticasone (FLONASE) 50 MCG/ACT nasal spray Place 2 sprays into the nose daily as needed for allergies.    [provider]  hydrochlorothiazide (MICROZIDE) 12.5 MG capsule Take 12.5 mg by mouth daily.    [provider]  LORazepam (ATIVAN) 1 MG tablet Take 1 mg by mouth at bedtime as needed for sleep.    [provider]  Multiple Vitamin (MULTIVITAMIN WITH MINERALS) TABS tablet Take 1 tablet by mouth daily.    [provider]  omeprazole (PRILOSEC) 40 MG capsule Take 40 mg by mouth daily as needed (acid reflux).    [provider]  potassium chloride SA (K-DUR,KLOR-CON) 20 MEQ tablet Take 20 mEq by mouth daily.    [provider]    Allergies Ace inhibitors  Family History  Problem Relation Age of Onset  . Breast cancer Neg Hx     Social  History Social History   Tobacco Use  . Smoking status: Former Research scientist (life sciences)  . Smokeless tobacco: Never Used  Substance Use Topics  . Alcohol use: No  . Drug use: No    Review of Systems  Constitutional: No fever/chills Eyes: No visual changes. ENT: No sore throat. Cardiovascular:  chest pain. Respiratory: Denies shortness of breath. Gastrointestinal: No abdominal pain.  No nausea, no vomiting.  No diarrhea.  No constipation. Genitourinary: Negative for dysuria. Musculoskeletal: Negative for back pain. Skin: Negative for rash. Neurological: Negative for headaches, focal weakness or  numbness.   ____________________________________________   PHYSICAL EXAM:  VITAL SIGNS: ED Triage Vitals  Enc Vitals Group     BP 12/28/17 1922 (!) 146/58     Pulse Rate 12/28/17 1922 (!) 57     Resp 12/28/17 1922 18     Temp 12/28/17 1922 99.1 F (37.3 C)     Temp Source 12/28/17 1922 Oral     SpO2 12/28/17 1922 99 %     Weight 12/28/17 1922 182 lb (82.6 kg)     Height 12/28/17 1922 5\' 4"  (1.626 m)     Head Circumference --      Peak Flow --      Pain Score 12/28/17 1936 0     Pain Loc --      Pain Edu? --      Excl. in [place]? --     Constitutional: Alert and oriented. Well appearing and in no acute distress. Eyes: Conjunctivae are normal. PERRL. EOMI. Head: Atraumatic. Nose: No congestion/rhinnorhea. Mouth/Throat: Mucous membranes are moist.  Oropharynx non-erythematous. Cardiovascular: Normal rate, regular rhythm. Grossly normal heart sounds.  Good peripheral circulation. Respiratory: Normal respiratory effort.  No retractions. Lungs CTAB. Gastrointestinal: Soft and nontender. No distention.  Positive bowel sounds Musculoskeletal: No lower extremity tenderness nor edema.   Neurologic:  Normal speech and language.  Skin:  Skin is warm, dry and intact.  Psychiatric: Mood and affect are normal.   ____________________________________________   LABS (all labs ordered are listed, but only abnormal results are displayed)  Labs Reviewed  BASIC METABOLIC PANEL - Abnormal; Notable for the following components:      Result Value   Potassium 3.1 (*)    Glucose, Bld 118 (*)    All other components within normal limits  CBC - Abnormal; Notable for the following components:   MCV 79.3 (*)    RDW 15.5 (*)    All other components within normal limits  TROPONIN I  TROPONIN I   ____________________________________________  EKG  ED ECG REPORT I, Loney Hering, the attending physician, personally viewed and interpreted this ECG.   Date: 12/28/2017  EKG Time: 1920   Rate: 56  Rhythm: sinus bradycardia  Axis: normal  Intervals:none  ST&T Change: none  ____________________________________________  RADIOLOGY  ED MD interpretation: Chest x-ray: No focal consolidation  Official radiology report(s): Dg Chest 2 View  Result Date: 12/28/2017 CLINICAL DATA:  72 year old female with chest pain. EXAM: CHEST - 2 VIEW COMPARISON:  Chest radiograph dated 06/21/2016 FINDINGS: There is no focal consolidation, pleural effusion, or pneumothorax. Mild peribronchial densities which may represent reactive small airway disease versus viral infection. Clinical correlation is recommended. The cardiac silhouette is within normal limits. No acute osseous pathology. IMPRESSION: No focal consolidation. Findings may represent reactive small airway disease. Clinical correlation is recommended. Electronically Signed   By: Anner Crete M.D.   On: 12/28/2017 19:54    ____________________________________________   PROCEDURES  Procedure(s)  performed: None  Procedures  Critical Care performed: No  ____________________________________________   INITIAL IMPRESSION / ASSESSMENT AND PLAN / ED COURSE  As part of my medical decision making, I reviewed the following data within the electronic MEDICAL RECORD NUMBER Notes from prior ED visits and [place] Controlled Substance Database   This is a 72 year old female who comes into the hospital today with some intermittent chest pain.  She reports that sharp pain with no radiation no pressure no sweats or no other secondary symptoms.  My differential diagnosis includes musculoskeletal pain, acute coronary syndrome, gastritis,  We did check some blood work on the patient to include a CBC BMP and a troponin.  The patient also had a chest x-ray that was negative.  I repeated the patient's troponin after 4 hours and the patient's repeat troponin was negative.  I did discuss with the patient that she should follow-up with cardiology to receive  a stress test.  She reports that she has had one in the past but it has been a while.  The patient sees Dr. Lamonte Sakai so I will referred the patient to Dr. Humphrey Rolls for further evaluation of her chest pain.  She will be discharged home.      ____________________________________________   FINAL CLINICAL IMPRESSION(S) / ED DIAGNOSES  Final diagnoses:  Chest pain, unspecified type     ED Discharge Orders    None       Note:  This document was prepared using Dragon voice recognition software and may include unintentional dictation errors.    Loney Hering, MD 12/29/17 [phone]

## 2017-12-29 NOTE — Discharge Instructions (Signed)
PLease follow up with your primary care physician and cardiology for further evaluation of your chest pain

## 2018-01-13 ENCOUNTER — Ambulatory Visit
Admission: RE | Admit: 2018-01-13 | Discharge: 2018-01-13 | Disposition: A | Discharge status: Home / Self Care | Payer: Medicare HMO | Source: Ambulatory Visit | Attending: Internal Medicine | Admitting: Internal Medicine

## 2018-01-13 DIAGNOSIS — Z1231 Encounter for screening mammogram for malignant neoplasm of breast: Secondary | ICD-10-CM | POA: Diagnosis present

## 2018-03-03 ENCOUNTER — Other Ambulatory Visit: Payer: Self-pay

## 2018-03-04 NOTE — Discharge Instructions (Signed)
INSTRUCTIONS FOLLOWING OCULOPLASTIC SURGERY °AMY M. FOWLER, MD ° °AFTER YOUR EYE SURGERY, THER ARE MANY THINGS THWIHC YOU, THE PATIENT, CAN DO TO ASSURE THE BEST POSSIBLE RESULT FROM YOUR OPERATION.  THIS SHEET SHOULD BE REFERRED TO WHENEVER QUESTIONS ARISE.  IF THERE ARE ANY QUESTIONS NOT ANSWERED HERE, DO NOT HESITATE TO CALL OUR OFFICE AT [phone] OR [phone].  THERE IS ALWAYS [facility] AVAILABLE TO CALL IF QUESTIONS OR PROBLEMS ARISE. ° °VISION: Your vision may be blurred and out of focus after surgery until you are able to stop using your ointment, swelling resolves and your eye(s) heal. This may take 1 to 2 weeks at the least.  If your vision becomes gradually more dim or dark, this is not normal and you need to call our office immediately. ° °EYE CARE: For the first 48 hours after surgery, use ice packs frequently - “20 minutes on, 20 minutes off” - to help reduce swelling and bruising.  Small bags of frozen peas or corn make good ice packs along with cloths soaked in ice water.  If you are wearing a patch or other type of dressing following surgery, keep this on for the amount of time specified by your doctor.  For the first week following surgery, you will need to treat your stitches with great care.  If is OK to shower, but take care to not allow soapy water to run into your eye(s) to help reduce changes of infection.  You may gently clean the eyelashes and around the eye(s) with cotton balls and sterile water, BUT DO NOT RUB THE STITCHES VIGOROUSLY.  Keeping your stitches moist with ointment will help promote healing with minimal scar formation. ° °ACTIVITY: When you leave the surgery center, you should go home, rest and be inactive.  The eye(s) may feel scratchy and keeping the eyes closed will allow for faster healing.  The first week following surgery, avoid straining (anything making the face turn red) or lifting over 20 pounds.  Additionally, avoid bending which causes your head to go below  your waist.  Using your eyes will NOT harm them, so feel free to read, watch television, use the computer, etc as desired.  Driving depends on each individual, so check with your doctor if you have questions about driving. ° °MEDICATIONS:  You will be given a prescription for an ointment to use 4 times a day on your stitches.  You can use the ointment in your eyes if they feel scratchy or irritated.  If you eyelid(s) don’t close completely when you sleep, put some ointment in your eyes before bedtime. ° °EMERGENCY: If you experience SEVERE EYE PAIN OR HEADACHE UNRELIEVED BY TYLENOL OR PERCOCET, NAUSEA OR VOMITING, WORSENING REDNESS, OR WORSENING VISION (ESPECIALLY VISION THAT WA INITIALLY BETTER) CALL [phone] OR [phone] DURING BUSINESS HOURS OR AFTER HOURS. ° °General Anesthesia, Adult, Care After °These instructions provide you with information about caring for yourself after your procedure. Your health care provider may also give you more specific instructions. Your treatment has been planned according to current medical practices, but problems sometimes occur. Call your health care provider if you have any problems or questions after your procedure. °What can I expect after the procedure? °After the procedure, it is common to have: °· Vomiting. °· A sore throat. °· Mental slowness. ° °It is common to feel: °· Nauseous. °· Cold or shivery. °· Sleepy. °· Tired. °· Sore or achy, even in parts of your body where you did not have surgery. ° °  Follow these instructions at home: °For at least 24 hours after the procedure: °· Do not: °? Participate in activities where you could fall or become injured. °? Drive. °? Use heavy machinery. °? Drink alcohol. °? Take sleeping pills or medicines that cause drowsiness. °? Make important decisions or sign legal documents. °? Take care of children on your own. °· Rest. °Eating and drinking °· If you vomit, drink water, juice, or soup when you can drink without  vomiting. °· Drink enough fluid to keep your urine clear or pale yellow. °· Make sure you have little or no nausea before eating solid foods. °· Follow the diet recommended by your health care provider. °General instructions °· Have a responsible adult stay with you until you are awake and alert. °· Return to your normal activities as told by your health care provider. Ask your health care provider what activities are safe for you. °· Take over-the-counter and prescription medicines only as told by your health care provider. °· If you smoke, do not smoke without supervision. °· Keep all follow-up visits as told by your health care provider. This is important. °Contact a health care provider if: °· You continue to have nausea or vomiting at home, and medicines are not helpful. °· You cannot drink fluids or start eating again. °· You cannot urinate after 8-12 hours. °· You develop a skin rash. °· You have fever. °· You have increasing redness at the site of your procedure. °Get help right away if: °· You have difficulty breathing. °· You have chest pain. °· You have unexpected bleeding. °· You feel that you are having a life-threatening or urgent problem. °This information is not intended to replace advice given to you by your health care provider. Make sure you discuss any questions you have with your health care provider. °Document Released: 01/12/2001 Document Revised: 03/10/2016 Document Reviewed: 09/20/2015 °Elsevier Interactive Patient Education © 2018 [employer] ° °

## 2018-03-09 ENCOUNTER — Ambulatory Visit
Admission: RE | Admit: 2018-03-09 | Discharge: 2018-03-09 | Disposition: A | Discharge status: Home / Self Care | Payer: Medicare HMO | Source: Ambulatory Visit | Attending: Ophthalmology | Admitting: Ophthalmology

## 2018-03-09 ENCOUNTER — Ambulatory Visit: Payer: Medicare HMO | Admitting: Anesthesiology

## 2018-03-09 ENCOUNTER — Encounter
Admission: RE | Disposition: A | Discharge status: Home / Self Care | Payer: Self-pay | Source: Ambulatory Visit | Attending: Ophthalmology

## 2018-03-09 DIAGNOSIS — Z87891 Personal history of nicotine dependence: Secondary | ICD-10-CM | POA: Diagnosis not present

## 2018-03-09 DIAGNOSIS — E7800 Pure hypercholesterolemia, unspecified: Secondary | ICD-10-CM | POA: Diagnosis not present

## 2018-03-09 DIAGNOSIS — I10 Essential (primary) hypertension: Secondary | ICD-10-CM | POA: Insufficient documentation

## 2018-03-09 DIAGNOSIS — K219 Gastro-esophageal reflux disease without esophagitis: Secondary | ICD-10-CM | POA: Diagnosis not present

## 2018-03-09 DIAGNOSIS — Z79899 Other long term (current) drug therapy: Secondary | ICD-10-CM | POA: Diagnosis not present

## 2018-03-09 DIAGNOSIS — H02403 Unspecified ptosis of bilateral eyelids: Secondary | ICD-10-CM | POA: Diagnosis present

## 2018-03-09 HISTORY — PX: PTOSIS REPAIR: SHX6568

## 2018-03-09 HISTORY — DX: Presence of dental prosthetic device (complete) (partial): Z97.2

## 2018-03-09 HISTORY — PX: BROW LIFT: SHX178

## 2018-03-09 HISTORY — DX: Pure hypercholesterolemia, unspecified: E78.00

## 2018-03-09 HISTORY — DX: Age-related osteoporosis without current pathological fracture: M81.0

## 2018-03-09 SURGERY — MINOR BLEPHAROPLASTY
Anesthesia: Monitor Anesthesia Care | Site: Eye | Laterality: Bilateral | Wound class: Clean

## 2018-03-09 MED ORDER — MIDAZOLAM HCL 2 MG/2ML IJ SOLN
INTRAMUSCULAR | Status: DC | PRN
  Administered 2018-03-09 (×2): 1 mg via INTRAVENOUS

## 2018-03-09 MED ORDER — TRAMADOL HCL 50 MG PO TABS: ORAL_TABLET | ORAL | 0 refills | Status: DC

## 2018-03-09 MED ORDER — LIDOCAINE-EPINEPHRINE 2 %-1:100000 IJ SOLN
INTRAMUSCULAR | Status: DC | PRN
  Administered 2018-03-09: .5 mL via OPHTHALMIC
  Administered 2018-03-09: 2 mL via OPHTHALMIC

## 2018-03-09 MED ORDER — DEXMEDETOMIDINE HCL 200 MCG/2ML IV SOLN
INTRAVENOUS | Status: DC | PRN
  Administered 2018-03-09 (×2): 4 ug via INTRAVENOUS

## 2018-03-09 MED ORDER — FENTANYL CITRATE (PF) 100 MCG/2ML IJ SOLN: 25.0000 ug | INTRAMUSCULAR | Status: DC | PRN

## 2018-03-09 MED ORDER — ALFENTANIL 500 MCG/ML IJ INJ
INJECTION | INTRAVENOUS | Status: DC | PRN
  Administered 2018-03-09: 500 ug via INTRAVENOUS
  Administered 2018-03-09 (×2): 250 ug via INTRAVENOUS

## 2018-03-09 MED ORDER — ACETAMINOPHEN 160 MG/5ML PO SOLN: 325.0000 mg | ORAL | Status: DC | PRN

## 2018-03-09 MED ORDER — TETRACAINE HCL 0.5 % OP SOLN
OPHTHALMIC | Status: DC | PRN
  Administered 2018-03-09: 2 [drp] via OPHTHALMIC

## 2018-03-09 MED ORDER — BSS IO SOLN
INTRAOCULAR | Status: DC | PRN
  Administered 2018-03-09: 15 mL

## 2018-03-09 MED ORDER — ACETAMINOPHEN 325 MG PO TABS
325.0000 mg | ORAL_TABLET | ORAL | Status: DC | PRN
  Administered 2018-03-09: 650 mg via ORAL

## 2018-03-09 MED ORDER — LACTATED RINGERS IV SOLN
10.0000 mL/h | INTRAVENOUS | Status: DC
  Administered 2018-03-09: 10 mL/h via INTRAVENOUS
  Administered 2018-03-09: 13:00:00 via INTRAVENOUS

## 2018-03-09 MED ORDER — ERYTHROMYCIN 5 MG/GM OP OINT: TOPICAL_OINTMENT | OPHTHALMIC | 3 refills | Status: DC

## 2018-03-09 SURGICAL SUPPLY — 36 items
APPLICATOR COTTON TIP WD 3 STR (MISCELLANEOUS) ×6 IMPLANT
BLADE SURG 15 STRL LF DISP TIS (BLADE) ×1 IMPLANT
BLADE SURG 15 STRL SS (BLADE) ×2
CORD BIP STRL DISP 12FT (MISCELLANEOUS) ×3 IMPLANT
DRAPE HEAD BAR (DRAPES) ×3 IMPLANT
GAUZE SPONGE 4X4 12PLY STRL (GAUZE/BANDAGES/DRESSINGS) ×3 IMPLANT
GAUZE SPONGE NON-WVN 2X2 STRL (MISCELLANEOUS) ×10 IMPLANT
GLOVE SURG LX 7.0 MICRO (GLOVE) ×4
GLOVE SURG LX STRL 7.0 MICRO (GLOVE) ×2 IMPLANT
MARKER SKIN XFINE TIP W/RULER (MISCELLANEOUS) ×3 IMPLANT
NEEDLE FILTER BLUNT 18X 1/2SAF (NEEDLE) ×2
NEEDLE FILTER BLUNT 18X1 1/2 (NEEDLE) ×1 IMPLANT
NEEDLE HYPO 30X.5 LL (NEEDLE) ×6 IMPLANT
PACK DRAPE NASAL/ENT (PACKS) ×3 IMPLANT
SOL PREP PVP 2OZ (MISCELLANEOUS) ×3
SOLUTION PREP PVP 2OZ (MISCELLANEOUS) ×1 IMPLANT
SPONGE VERSALON 2X2 STRL (MISCELLANEOUS) ×20
SUT CHROMIC 4-0 (SUTURE)
SUT CHROMIC 4-0 M2 12X2 ARM (SUTURE)
SUT CHROMIC 5 0 P 3 (SUTURE) IMPLANT
SUT ETHILON 4 0 CL P 3 (SUTURE) IMPLANT
SUT MERSILENE 4-0 S-2 (SUTURE) IMPLANT
SUT PDS AB 4-0 P3 18 (SUTURE) IMPLANT
SUT PLAIN GUT (SUTURE) ×3 IMPLANT
SUT PROLENE 5 0 P 3 (SUTURE) IMPLANT
SUT PROLENE 6 0 P 1 18 (SUTURE) ×6 IMPLANT
SUT SILK 4 0 G 3 (SUTURE) IMPLANT
SUT VIC AB 5-0 P-3 18X BRD (SUTURE) IMPLANT
SUT VIC AB 5-0 P3 18 (SUTURE)
SUT VICRYL 6-0  S14 CTD (SUTURE)
SUT VICRYL 6-0 S14 CTD (SUTURE) IMPLANT
SUT VICRYL 7 0 TG140 8 (SUTURE) IMPLANT
SUTURE CHRMC 4-0 M2 12X2 ARM (SUTURE) IMPLANT
SYR 3ML LL SCALE MARK (SYRINGE) ×3 IMPLANT
SYRINGE 10CC LL (SYRINGE) ×3 IMPLANT
WATER STERILE IRR 250ML POUR (IV SOLUTION) ×3 IMPLANT

## 2018-03-09 NOTE — Transfer of Care (Signed)
Immediate Anesthesia Transfer of Care Note  Patient: Mercedes Vasquez  Procedure(s) Performed: BLEPHAROPLASTY REPAIR RESECT (Bilateral Eye) PTOSIS REPAIR (Bilateral Eye)  Patient Location: PACU  Anesthesia Type: MAC  Level of Consciousness: awake, alert  and patient cooperative  Airway and Oxygen Therapy: Patient Spontanous Breathing and Patient connected to supplemental oxygen  Post-op Assessment: Post-op Vital signs reviewed, Patient's Cardiovascular Status Stable, Respiratory Function Stable, Patent Airway and No signs of Nausea or vomiting  Post-op Vital Signs: Reviewed and stable  Complications: No apparent anesthesia complications

## 2018-03-09 NOTE — Anesthesia Preprocedure Evaluation (Addendum)
Anesthesia Evaluation  Patient identified by MRN, date of birth, ID band Patient awake    Reviewed: Allergy & Precautions, NPO status , Patient's Chart, lab work & pertinent test results  Airway Mallampati: II  TM Distance: >3 FB     Dental  (+) Lower Dentures   Pulmonary neg recent URI, former smoker,    breath sounds clear to auscultation       Cardiovascular hypertension, (-) angina Rhythm:Regular Rate:Normal  Hyperlipidemia    Neuro/Psych Anxiety    GI/Hepatic Neg liver ROS, GERD  Medicated,  Endo/Other  BMI 31  Renal/GU negative Renal ROS     Musculoskeletal   Abdominal   Peds  Hematology negative hematology ROS (+)   Anesthesia Other Findings   Reproductive/Obstetrics                            Anesthesia Physical Anesthesia Plan  ASA: II  Anesthesia Plan: MAC   Post-op Pain Management:    Induction: Intravenous  PONV Risk Score and Plan:   Airway Management Planned: Nasal Cannula  Additional Equipment:   Intra-op Plan:   Post-operative Plan:   Informed Consent: I have reviewed the patients History and Physical, chart, labs and discussed the procedure including the risks, benefits and alternatives for the proposed anesthesia with the patient or authorized representative who has indicated his/her understanding and acceptance.     Plan Discussed with: CRNA  Anesthesia Plan Comments:         Anesthesia Quick Evaluation

## 2018-03-09 NOTE — Interval H&P Note (Signed)
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 After consideration of risks, benefits and other options for treatment, the patient has consented to  Procedure(s): BLEPHAROPLASTY REPAIR RESECT (Bilateral) PTOSIS REPAIR (Bilateral) as a surgical intervention .  The patient's history has been reviewed, patient examined, no change in status, stable for surgery.  I have reviewed the patient's chart and labs.  Questions were answered to the patient's satisfaction.     Vickki Muff, Shada Nienaber M

## 2018-03-09 NOTE — Op Note (Signed)
Preoperative Diagnosis:  Visually significant blepharoptosis bilateral upper Eyelid(s)  Postoperative Diagnosis:  Same.  Procedure(s) Performed:   Blepharoptosis repair with levator aponeurosis advancement bilateral upper Eyelid(s)  Teaching Surgeon: Philis Pique. Vickki Muff, M.D.  Assistants: none  Anesthesia: MAC  Specimens: None.  Estimated Blood Loss: Minimal.  Complications: None.  Operative Findings: None Dictated  PROCEDURE:  Allergies were reviewed and the patient is allergic to ace inhibitors..   After the risks, benefits, complications and alternatives were discussed with the patient, appropriate informed consent was obtained. While seated in an upright position and looking in primary gaze, the mid pupillary line was marked on the upper eyelid margins bilaterally. The patient was then brought to the operating suite and reclined supine.  Timeout was conducted and the patient was sedated. Local anesthetic consisting of a 50-50 mixture of 2% lidocaine with epinephrine 0.75% bupivacaine with added Hylenex was injected subcutaneously to the bilateral upper eyelid(s). After adequate local was instilled, the patient was prepped and draped in the usual sterile fashion for eyelid surgery.   Attention was turned to the upper eyelids. A 36m upper eyelid crease incision line was marked with calipers on both upper eyelid(s).  Attention was turned to the right upper eyelid. A #15 blade was used to open the premarked incision line and hemostasis was obtained with bipolar cautery. Westcott scissors were then used to transect through orbicularis for the length of the incision down to the tarsal plate. Epitarsus was dissected to create a smooth surface to suture to. Dissection was then carried superiorly in the plane between orbicularis and orbital septum. Once the preaponeurotic fat pocket was identified, the orbital septum was opened. This revealed the levator and its aponeurosis.    Attention was  then turned to the opposite eyelid where the same procedure was performed in the same manner.   3 interrupted 6-0 Prolene sutures were then passed partial thickness through the tarsal plates of both upper eyelid(s). These sutures were placed in line with the mid pupillary, medial limbal, and lateral limbal lines. The sutures were fixed to the levator aponeurosis and adjusted until a nice lid height and contour were achieved. Once nice symmetry was achieved, the skin incisions were closed with a running 6-0 fast absorbing plain suture. The patient tolerated the procedure well.  Erythromycin ophthalmic ointment was applied to the incision site(s) followed by ice packs. The patient was taken to the recovery area where she recovered without difficulty.  Post-Op Plan/Instructions:  Ms. E[place]qu[place]was instructed to use ice packs frequently for the next 48 hours. Her was instructed to use erythromycin ophthalmic ointment on her incisions 4 times a day for the next 12 to 14 days. She was given a prescription for Percocet for pain control should Tylenol not be effective. [facility] [place].  Teaching Surgeon Attestation: None  Edith Groleau M. FVickki Muff M.D. Attending,Ophthalmology

## 2018-03-09 NOTE — H&P (Signed)
See the history and physical completed at [facility] on 02/16/18 and scanned into the chart.

## 2018-03-09 NOTE — Anesthesia Postprocedure Evaluation (Signed)
Anesthesia Post Note  Patient: Mercedes Vasquez  Procedure(s) Performed: BLEPHAROPLASTY REPAIR RESECT (Bilateral Eye) PTOSIS REPAIR (Bilateral Eye)  Patient location during evaluation: PACU Anesthesia Type: MAC Level of consciousness: awake and alert Pain management: pain level controlled Vital Signs Assessment: post-procedure vital signs reviewed and stable Respiratory status: spontaneous breathing, nonlabored ventilation, respiratory function stable and patient connected to nasal cannula oxygen Cardiovascular status: stable and blood pressure returned to baseline Postop Assessment: no apparent nausea or vomiting Anesthetic complications: no    Veda Canning

## 2018-03-10 ENCOUNTER — Encounter: Payer: Self-pay | Admitting: Ophthalmology

## 2019-02-02 ENCOUNTER — Other Ambulatory Visit: Payer: Self-pay | Admitting: Family

## 2019-02-02 DIAGNOSIS — M25561 Pain in right knee: Secondary | ICD-10-CM

## 2019-02-03 ENCOUNTER — Ambulatory Visit
Admission: RE | Admit: 2019-02-03 | Discharge: 2019-02-03 | Disposition: A | Discharge status: Home / Self Care | Payer: Medicare HMO | Source: Ambulatory Visit | Attending: Family | Admitting: Family

## 2019-02-03 ENCOUNTER — Other Ambulatory Visit: Payer: Self-pay

## 2019-02-03 DIAGNOSIS — M25561 Pain in right knee: Secondary | ICD-10-CM | POA: Insufficient documentation

## 2019-02-11 DIAGNOSIS — M222X9 Patellofemoral disorders, unspecified knee: Secondary | ICD-10-CM | POA: Insufficient documentation

## 2019-02-14 ENCOUNTER — Encounter: Payer: Self-pay | Admitting: Oncology

## 2019-02-14 ENCOUNTER — Inpatient Hospital Stay: Payer: Medicare HMO | Attending: Oncology | Admitting: Oncology

## 2019-02-14 ENCOUNTER — Other Ambulatory Visit: Payer: Self-pay

## 2019-02-14 DIAGNOSIS — D696 Thrombocytopenia, unspecified: Secondary | ICD-10-CM

## 2019-02-14 DIAGNOSIS — D72819 Decreased white blood cell count, unspecified: Secondary | ICD-10-CM

## 2019-02-14 DIAGNOSIS — E7800 Pure hypercholesterolemia, unspecified: Secondary | ICD-10-CM | POA: Insufficient documentation

## 2019-02-14 DIAGNOSIS — Z87891 Personal history of nicotine dependence: Secondary | ICD-10-CM | POA: Insufficient documentation

## 2019-02-14 DIAGNOSIS — F419 Anxiety disorder, unspecified: Secondary | ICD-10-CM | POA: Insufficient documentation

## 2019-02-14 DIAGNOSIS — I10 Essential (primary) hypertension: Secondary | ICD-10-CM | POA: Insufficient documentation

## 2019-02-14 DIAGNOSIS — K219 Gastro-esophageal reflux disease without esophagitis: Secondary | ICD-10-CM | POA: Insufficient documentation

## 2019-02-14 DIAGNOSIS — Z79899 Other long term (current) drug therapy: Secondary | ICD-10-CM | POA: Insufficient documentation

## 2019-02-14 DIAGNOSIS — D709 Neutropenia, unspecified: Secondary | ICD-10-CM

## 2019-02-14 NOTE — Progress Notes (Signed)
Pt says [facility] for same thing. She has low wbc and low plt ct. She is not sx from the counts. And she feels fine

## 2019-02-15 ENCOUNTER — Other Ambulatory Visit: Payer: Self-pay

## 2019-02-15 NOTE — Progress Notes (Signed)
I connected with Mercedes Vasquez on 02/15/19 at  1:30 PM EDT by video enabled telemedicine visit and verified that I am speaking with the correct person using two identifiers.   I discussed the limitations, risks, security and privacy concerns of performing an evaluation and management service by telemedicine and the availability of in-person appointments. I also discussed with the patient that there may be a patient responsible charge related to this service. The patient expressed understanding and agreed to proceed.  Other persons participating in the visit and their role in the encounter:  Patients daughter  Patient's location:  home Provider's location:  home  Reason for referral: Leukopenia/thrombocytopenia Referring provider: Dr. Lamonte Sakai  History of present illness: Patient is a 73 year old African-American female with a past medical history significant for hypertension and hypercholesterolemia.  She has been referred to [place] for leukopenia/thrombocytopenia. Most recent CBC from 01/14/2019 showed white count of 2.5, H&H of 12/37.3 with a platelet count 218.  Differential mainly shows neutropenia with an ANC of 1.0.  Looking back at her prior CBCs dating back to 2013 patient has had a waxing and waning white count ranging between 3.1-4.5 with an [place] as low as 1.3.  She has also had mild thrombocytopenia with platelet count between 120s to 140s.  Patient overall reports feeling well and denies any symptoms of recurrent infections fevers or hospitalizations.  Her appetite is good and her weight has remained stable.  She has been having some ongoing right knee pain for which she underwent an MRI recently which showed a tear in her right medial meniscus.  Patient reports that she is also seen hematology many years ago at [facility] and underwent investigation for low white count and low platelet count which was essentially unremarkable.  No family history of any lymphoma or leukemia.  She reports having  occasional night sweats when her head is soaked in sweat.  No personal history of any autoimmune disorders.  She denies any joint pain joint swelling or skin rash.  Denies any over-the-counter medications or herbal supplements.     Review of Systems  Constitutional: Negative for chills, fever, malaise/fatigue and weight loss.  HENT: Negative for congestion, ear discharge and nosebleeds.   Eyes: Negative for blurred vision.  Respiratory: Negative for cough, hemoptysis, sputum production, shortness of breath and wheezing.   Cardiovascular: Negative for chest pain, palpitations, orthopnea and claudication.  Gastrointestinal: Negative for abdominal pain, blood in stool, constipation, diarrhea, heartburn, melena, nausea and vomiting.  Genitourinary: Negative for dysuria, flank pain, frequency, hematuria and urgency.  Musculoskeletal: Positive for joint pain. Negative for back pain and myalgias.  Skin: Negative for rash.  Neurological: Negative for dizziness, tingling, focal weakness, seizures, weakness and headaches.  Endo/Heme/Allergies: Does not bruise/bleed easily.  Psychiatric/Behavioral: Negative for depression and suicidal ideas. The patient does not have insomnia.     Allergies  Allergen Reactions  . Ace Inhibitors Swelling    enalapril    Past Medical History:  Diagnosis Date  . Anemia   . Anxiety   . GERD (gastroesophageal reflux disease)   . Heart murmur   . Hypercholesteremia   . Hypertension    controlled on meds  . Osteoporosis   . Wears dentures    lower    Past Surgical History:  Procedure Laterality Date  . ABDOMINAL HYSTERECTOMY    . BROW LIFT Bilateral 03/09/2018   Procedure: BLEPHAROPLASTY REPAIR RESECT;  Surgeon: Karle Starch, MD;  Location: [place];  Service: Ophthalmology;  Laterality: Bilateral;  . CATARACT EXTRACTION W/PHACO Right 10/29/2016   Procedure: CATARACT EXTRACTION PHACO AND INTRAOCULAR LENS PLACEMENT (IOC);  Surgeon: Estill Cotta, MD;  Location: [facility];  Service: Ophthalmology;  Laterality: Right;  [place]  01:04 AP% 24.1 CDE 28.71 fluid pack lot # [id number]  . COLONOSCOPY    . FOOT SURGERY     bunion  . PTOSIS REPAIR Bilateral 03/09/2018   Procedure: PTOSIS REPAIR;  Surgeon: Karle Starch, MD;  Location: [place];  Service: Ophthalmology;  Laterality: Bilateral;    Social History   Socioeconomic History  . Marital status: Married    Spouse name: Not on file  . Number of children: Not on file  . Years of education: Not on file  . Highest education level: Not on file  Occupational History  . Not on file  Social Needs  . Financial resource strain: Not on file  . Food insecurity:    Worry: Not on file    Inability: Not on file  . Transportation needs:    Medical: Not on file    Non-medical: Not on file  Tobacco Use  . Smoking status: Former Smoker    Types: Cigarettes    Last attempt to quit: 09/19/1969    Years since quitting: 49.4  . Smokeless tobacco: Never Used  Substance and Sexual Activity  . Alcohol use: No  . Drug use: No  . Sexual activity: Not Currently  Lifestyle  . Physical activity:    Days per week: Not on file    Minutes per session: Not on file  . Stress: Not on file  Relationships  . Social connections:    Talks on phone: Not on file    Gets together: Not on file    Attends religious service: Not on file    Active member of club or organization: Not on file    Attends meetings of clubs or organizations: Not on file    Relationship status: Not on file  . Intimate partner violence:    Fear of current or ex partner: Not on file    Emotionally abused: Not on file    Physically abused: Not on file    Forced sexual activity: Not on file  Other Topics Concern  . Not on file  Social History Narrative  . Not on file    Family History  Problem Relation Age of Onset  . Pancreatic cancer Mother   . Heart attack Maternal Aunt   . Lung cancer Cousin   . Lung  cancer Cousin   . Breast cancer Neg Hx      Current Outpatient Medications:  .  amLODipine (NORVASC) 5 MG tablet, Take 5 mg by mouth daily. 11am, Disp: , Rfl:  .  atorvastatin (LIPITOR) 20 MG tablet, Take 20 mg by mouth daily. 11am, Disp: , Rfl:  .  B Complex Vitamins (B COMPLEX PO), Take 1 Dose by mouth daily., Disp: , Rfl:  .  cetirizine (ZYRTEC) 10 MG tablet, Take 10 mg by mouth daily., Disp: , Rfl:  .  citalopram (CELEXA) 20 MG tablet, Take 20 mg by mouth daily., Disp: , Rfl:  .  docusate sodium (COLACE) 100 MG capsule, Take 1 tablet once or twice daily as needed for constipation while taking narcotic pain medicine (Patient taking differently: Take 100 mg by mouth daily as needed (constipation). ), Disp: 30 capsule, Rfl: 0 .  ergocalciferol (VITAMIN D2) 1.25 MG (50000 UT) capsule, Take 50,000 Units by mouth once  a week., Disp: , Rfl:  .  Ferrous Fumarate-Folic Acid (HEMATINIC/FOLIC ACID PO), Take 1 tablet by mouth daily., Disp: , Rfl:  .  fluticasone (FLONASE) 50 MCG/ACT nasal spray, Place 2 sprays into the nose daily as needed for allergies., Disp: , Rfl:  .  hydrochlorothiazide (MICROZIDE) 12.5 MG capsule, Take 12.5 mg by mouth daily., Disp: , Rfl:  .  ibandronate (BONIVA) 150 MG tablet, Take 150 mg by mouth every 30 (thirty) days. Take in the morning with a full glass of water, on an empty stomach, and do not take anything else by mouth or lie down for the next 30 min., Disp: , Rfl:  .  Lactobacillus (PROBIOTIC ACIDOPHILUS PO), Take 1 capsule by mouth daily., Disp: , Rfl:  .  LORazepam (ATIVAN) 1 MG tablet, Take 1 mg by mouth at bedtime as needed for sleep., Disp: , Rfl:  .  Omega-3 Fatty Acids (FISH OIL) 1000 MG CPDR, Take 1 capsule by mouth daily., Disp: , Rfl:  .  omeprazole (PRILOSEC) 40 MG capsule, Take 40 mg by mouth daily as needed (acid reflux)., Disp: , Rfl:  .  potassium chloride SA (K-DUR,KLOR-CON) 20 MEQ tablet, Take 20 mEq by mouth daily., Disp: , Rfl:  .  traMADol (ULTRAM)  50 MG tablet, Take 1 every 4-6 hours as needed for pain not controlled by Tylenol (Patient not taking: Reported on 02/14/2019), Disp: 6 tablet, Rfl: 0  Mr Tibia Fibula Right Wo Contrast  Result Date: 02/03/2019 CLINICAL DATA:  Persistent right knee pain radiating into the calf since fall in February. EXAM: MRI OF LOWER RIGHT EXTREMITY WITHOUT CONTRAST TECHNIQUE: Multiplanar, multisequence MR imaging of the right tibia and fibula was performed. No intravenous contrast was administered. COMPARISON:  None. FINDINGS: Bones/Joint/Cartilage Partially visualized patchy, symmetric T2 hyperintense, slightly T1 hyperintense marrow signal in the distal femoral metaphyses, most consistent with red marrow hyperplasia. No suspicious bone marrow signal abnormality. No fracture or dislocation. Normal alignment. Small right knee joint effusion. Ligaments Diminutive appearance of the right medial meniscus posterior root (series 16, image 19). Knee ligaments and menisci are otherwise grossly intact, although evaluation is limited due to large field of view. Muscles and Tendons Visualized tendons are intact. Faint, patchy edema within the proximal soleus and peroneal longus muscles. Small intramuscular lipoma versus focal fatty atrophy in the mid medial gastrocnemius muscle. Soft tissue No fluid collection or hematoma.  No soft tissue mass. IMPRESSION: 1. Diminutive appearance of the right medial meniscus posterior root could reflect a tear. Given limited evaluation due to large field of view, dedicated right knee MRI without contrast is recommended for further evaluation. 2. Faint, patchy edema within the right proximal soleus and peroneal longus muscles is nonspecific but could reflect early denervation. Electronically Signed   By: Titus Dubin M.D.   On: 02/03/2019 13:43    No images are attached to the encounter.   CMP Latest Ref Rng & Units 12/28/2017  Glucose 65 - 99 mg/dL 118(H)  BUN 6 - 20 mg/dL 13  Creatinine  0.44 - 1.00 mg/dL 0.71  Sodium 135 - 145 mmol/L 139  Potassium 3.5 - 5.1 mmol/L 3.1(L)  Chloride 101 - 111 mmol/L 105  CO2 22 - 32 mmol/L 27  Calcium 8.9 - 10.3 mg/dL 9.5   CBC Latest Ref Rng & Units 12/28/2017  WBC 3.6 - 11.0 K/uL 4.7  Hemoglobin 12.0 - 16.0 g/dL 12.7  Hematocrit 35.0 - 47.0 % 38.3  Platelets 150 - 440 K/uL 169  Observation/objective: Patient appears in no acute distress over your visit today.  Breathing is nonlabored  Assessment and plan: Patient is a 73 year old African-American female referred for leukopenia/thrombocytopenia  Patient has had chronic mild leukopenia/neutropenia dating back to 2013 and her white count has backslash remained between 3-4.  Most recent white count was 2.5 with an [place] of 1.  She is also had some baseline intermittent thrombocytopenia with a platelet count between 120s to 140s.  She does not have any clinical history of autoimmune symptoms or disorders.  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  At this time I am inclined to monitor her blood work conservatively without getting a bone marrow biopsy.  If there is a consistent downward trend in her white count which I will monitor closely I will consider a bone marrow biopsy at that time.  I will check a CBC with differential, smear review, B12, folate, TSH, HIV and hepatitis C testing.  I will also get a peripheral flow cytometry and ANA comprehensive panel.   Follow-up instructions: Video visit with the patient 2 weeks after labs are done  I discussed the assessment and treatment plan with the patient. The patient was provided an opportunity to ask questions and all were answered. The patient agreed with the plan and demonstrated an understanding of the instructions.   The patient was advised to call back or seek an in-person evaluation if the symptoms worsen or if the condition fails to improve as anticipated.    Visit  Diagnosis: 1. Neutropenia, unspecified type ([place])   2. Thrombocytopenia ([place])     Dr. Randa Evens, MD, MPH [facility] at [facility] [phone] 02/15/2019 9:05 AM

## 2019-02-16 ENCOUNTER — Other Ambulatory Visit: Payer: Self-pay

## 2019-02-16 ENCOUNTER — Inpatient Hospital Stay: Payer: Medicare HMO

## 2019-02-16 DIAGNOSIS — F419 Anxiety disorder, unspecified: Secondary | ICD-10-CM | POA: Diagnosis not present

## 2019-02-16 DIAGNOSIS — I10 Essential (primary) hypertension: Secondary | ICD-10-CM | POA: Diagnosis not present

## 2019-02-16 DIAGNOSIS — Z79899 Other long term (current) drug therapy: Secondary | ICD-10-CM | POA: Diagnosis not present

## 2019-02-16 DIAGNOSIS — E7800 Pure hypercholesterolemia, unspecified: Secondary | ICD-10-CM | POA: Diagnosis not present

## 2019-02-16 DIAGNOSIS — K219 Gastro-esophageal reflux disease without esophagitis: Secondary | ICD-10-CM | POA: Diagnosis not present

## 2019-02-16 DIAGNOSIS — D696 Thrombocytopenia, unspecified: Secondary | ICD-10-CM | POA: Diagnosis not present

## 2019-02-16 DIAGNOSIS — Z87891 Personal history of nicotine dependence: Secondary | ICD-10-CM | POA: Diagnosis not present

## 2019-02-16 DIAGNOSIS — D709 Neutropenia, unspecified: Secondary | ICD-10-CM

## 2019-02-16 DIAGNOSIS — D72819 Decreased white blood cell count, unspecified: Secondary | ICD-10-CM | POA: Diagnosis present

## 2019-02-16 LAB — CBC WITH DIFFERENTIAL/PLATELET
Abs Immature Granulocytes: 0.04 10*3/uL (ref 0.00–0.07)
Basophils Absolute: 0 10*3/uL (ref 0.0–0.1)
Basophils Relative: 0 %
Eosinophils Absolute: 0.1 10*3/uL (ref 0.0–0.5)
Eosinophils Relative: 4 %
HCT: 35.6 % — ABNORMAL LOW (ref 36.0–46.0)
Hemoglobin: 11.3 g/dL — ABNORMAL LOW (ref 12.0–15.0)
Immature Granulocytes: 2 %
Lymphocytes Relative: 48 %
Lymphs Abs: 1.1 10*3/uL (ref 0.7–4.0)
MCH: 27.3 pg (ref 26.0–34.0)
MCHC: 31.7 g/dL (ref 30.0–36.0)
MCV: 86 fL (ref 80.0–100.0)
Monocytes Absolute: 0.1 10*3/uL (ref 0.1–1.0)
Monocytes Relative: 6 %
Neutro Abs: 0.9 10*3/uL — ABNORMAL LOW (ref 1.7–7.7)
Neutrophils Relative %: 40 %
Platelets: 124 10*3/uL — ABNORMAL LOW (ref 150–400)
RBC: 4.14 MIL/uL (ref 3.87–5.11)
RDW: 16.7 % — ABNORMAL HIGH (ref 11.5–15.5)
WBC: 2.3 10*3/uL — ABNORMAL LOW (ref 4.0–10.5)
nRBC: 0 % (ref 0.0–0.2)

## 2019-02-16 LAB — VITAMIN B12: Vitamin B-12: 1439 pg/mL — ABNORMAL HIGH (ref 180–914)

## 2019-02-16 LAB — TECHNOLOGIST SMEAR REVIEW: Plt Morphology: NORMAL

## 2019-02-16 LAB — FOLATE: Folate: 23 ng/mL (ref >5.9)

## 2019-02-16 LAB — TSH: TSH: 3.396 u[IU]/mL (ref 0.350–4.500)

## 2019-02-17 ENCOUNTER — Inpatient Hospital Stay: Payer: Medicare HMO | Attending: Oncology | Admitting: Oncology

## 2019-02-17 ENCOUNTER — Encounter: Payer: Self-pay | Admitting: Oncology

## 2019-02-17 DIAGNOSIS — D709 Neutropenia, unspecified: Secondary | ICD-10-CM

## 2019-02-17 DIAGNOSIS — D696 Thrombocytopenia, unspecified: Secondary | ICD-10-CM

## 2019-02-17 LAB — ANA COMPREHENSIVE PANEL
Anti JO-1: <0.2 AI (ref 0.0–0.9)
Centromere Ab Screen: <0.2 AI (ref 0.0–0.9)
Chromatin Ab SerPl-aCnc: 0.5 AI (ref 0.0–0.9)
ENA SM Ab Ser-aCnc: <0.2 AI (ref 0.0–0.9)
Ribonucleic Protein: <0.2 AI (ref 0.0–0.9)
SSA (Ro) (ENA) Antibody, IgG: 3.9 AI — ABNORMAL HIGH (ref 0.0–0.9)
SSB (La) (ENA) Antibody, IgG: <0.2 AI (ref 0.0–0.9)
Scleroderma (Scl-70) (ENA) Antibody, IgG: <0.2 AI (ref 0.0–0.9)
ds DNA Ab: <1 IU/mL (ref 0–9)

## 2019-02-17 LAB — HEPATITIS C ANTIBODY: HCV Ab: <0.1 s/co ratio (ref 0.0–0.9)

## 2019-02-17 LAB — HIV ANTIBODY (ROUTINE TESTING W REFLEX): HIV Screen 4th Generation wRfx: NONREACTIVE

## 2019-02-21 LAB — COMP PANEL: LEUKEMIA/LYMPHOMA

## 2019-02-21 NOTE — Progress Notes (Signed)
I connected with Mercedes Vasquez on 02/21/19 at  2:45 PM EDT by video enabled telemedicine visit and verified that I am speaking with the correct person using two identifiers.   I discussed the limitations, risks, security and privacy concerns of performing an evaluation and management service by telemedicine and the availability of in-person appointments. I also discussed with the patient that there may be a patient responsible charge related to this service. The patient expressed understanding and agreed to proceed.  Other persons participating in the visit and their role in the encounter:  Patients daughter  Patient's location:  home Provider's location:  [facility]  Chief Complaint:  Discuss blood work results  History of present illness: Patient is a 73 year old African-American female with a past medical history significant for hypertension and hypercholesterolemia.  She has been referred to [place] for leukopenia/thrombocytopenia. Most recent CBC from 01/14/2019 showed white count of 2.5, H&H of 12/37.3 with a platelet count 218.  Differential mainly shows neutropenia with an ANC of 1.0.  Looking back at her prior CBCs dating back to 2013 patient has had a waxing and waning white count ranging between 3.1-4.5 with an [place] as low as 1.3.  She has also had mild thrombocytopenia with platelet count between 120s to 140s.  Patient overall reports feeling well and denies any symptoms of recurrent infections fevers or hospitalizations.  Her appetite is good and her weight has remained stable.  She has been having some ongoing right knee pain for which she underwent an MRI recently which showed a tear in her right medial meniscus.  Patient reports that she is also seen hematology many years ago at [facility] and underwent investigation for low white count and low platelet count which was essentially unremarkable.  No family history of any lymphoma or leukemia.  She reports having occasional night sweats  when her head is soaked in sweat.  No personal history of any autoimmune disorders.  She denies any joint pain joint swelling or skin rash.  Denies any over-the-counter medications or herbal supplements.  Interval history she feels well and denies any new complaints today.  Denies any fever or infections since her last visit   Review of Systems  Constitutional: Positive for malaise/fatigue. Negative for chills, fever and weight loss.  HENT: Negative for congestion, ear discharge and nosebleeds.   Eyes: Negative for blurred vision.  Respiratory: Negative for cough, hemoptysis, sputum production, shortness of breath and wheezing.   Cardiovascular: Negative for chest pain, palpitations, orthopnea and claudication.  Gastrointestinal: Negative for abdominal pain, blood in stool, constipation, diarrhea, heartburn, melena, nausea and vomiting.  Genitourinary: Negative for dysuria, flank pain, frequency, hematuria and urgency.  Musculoskeletal: Negative for back pain, joint pain and myalgias.  Skin: Negative for rash.  Neurological: Negative for dizziness, tingling, focal weakness, seizures, weakness and headaches.  Endo/Heme/Allergies: Does not bruise/bleed easily.  Psychiatric/Behavioral: Negative for depression and suicidal ideas. The patient does not have insomnia.     Allergies  Allergen Reactions  . Ace Inhibitors Swelling    enalapril    Past Medical History:  Diagnosis Date  . Anemia   . Anxiety   . GERD (gastroesophageal reflux disease)   . Heart murmur   . Hypercholesteremia   . Hypertension    controlled on meds  . Osteoporosis   . Wears dentures    lower    Past Surgical History:  Procedure Laterality Date  . ABDOMINAL HYSTERECTOMY    . BROW LIFT Bilateral 03/09/2018  Procedure: BLEPHAROPLASTY REPAIR RESECT;  Surgeon: Karle Starch, MD;  Location: [place];  Service: Ophthalmology;  Laterality: Bilateral;  . CATARACT EXTRACTION W/PHACO Right 10/29/2016    Procedure: CATARACT EXTRACTION PHACO AND INTRAOCULAR LENS PLACEMENT (IOC);  Surgeon: Estill Cotta, MD;  Location: [facility];  Service: Ophthalmology;  Laterality: Right;  [place]  01:04 AP% 24.1 CDE 28.71 fluid pack lot # [id number]  . COLONOSCOPY    . FOOT SURGERY     bunion  . PTOSIS REPAIR Bilateral 03/09/2018   Procedure: PTOSIS REPAIR;  Surgeon: Karle Starch, MD;  Location: [place];  Service: Ophthalmology;  Laterality: Bilateral;    Social History   Socioeconomic History  . Marital status: Married    Spouse name: Not on file  . Number of children: Not on file  . Years of education: Not on file  . Highest education level: Not on file  Occupational History  . Not on file  Social Needs  . Financial resource strain: Not on file  . Food insecurity:    Worry: Not on file    Inability: Not on file  . Transportation needs:    Medical: Not on file    Non-medical: Not on file  Tobacco Use  . Smoking status: Former Smoker    Types: Cigarettes    Last attempt to quit: 09/19/1969    Years since quitting: 49.4  . Smokeless tobacco: Never Used  Substance and Sexual Activity  . Alcohol use: No  . Drug use: No  . Sexual activity: Not Currently  Lifestyle  . Physical activity:    Days per week: Not on file    Minutes per session: Not on file  . Stress: Not on file  Relationships  . Social connections:    Talks on phone: Not on file    Gets together: Not on file    Attends religious service: Not on file    Active member of club or organization: Not on file    Attends meetings of clubs or organizations: Not on file    Relationship status: Not on file  . Intimate partner violence:    Fear of current or ex partner: Not on file    Emotionally abused: Not on file    Physically abused: Not on file    Forced sexual activity: Not on file  Other Topics Concern  . Not on file  Social History Narrative  . Not on file    Family History  Problem Relation Age of Onset  .  Pancreatic cancer Mother   . Heart attack Maternal Aunt   . Lung cancer Cousin   . Lung cancer Cousin   . Breast cancer Neg Hx      Current Outpatient Medications:  .  amLODipine (NORVASC) 5 MG tablet, Take 5 mg by mouth daily. 11am, Disp: , Rfl:  .  atorvastatin (LIPITOR) 20 MG tablet, Take 20 mg by mouth daily. 11am, Disp: , Rfl:  .  B Complex Vitamins (B COMPLEX PO), Take 1 Dose by mouth daily., Disp: , Rfl:  .  cetirizine (ZYRTEC) 10 MG tablet, Take 10 mg by mouth daily., Disp: , Rfl:  .  citalopram (CELEXA) 20 MG tablet, Take 20 mg by mouth daily., Disp: , Rfl:  .  docusate sodium (COLACE) 100 MG capsule, Take 1 tablet once or twice daily as needed for constipation while taking narcotic pain medicine (Patient taking differently: Take 100 mg by mouth daily as needed (constipation). ), Disp: 30  capsule, Rfl: 0 .  ergocalciferol (VITAMIN D2) 1.25 MG (50000 UT) capsule, Take 50,000 Units by mouth once a week., Disp: , Rfl:  .  Ferrous Fumarate-Folic Acid (HEMATINIC/FOLIC ACID PO), Take 1 tablet by mouth daily., Disp: , Rfl:  .  fluticasone (FLONASE) 50 MCG/ACT nasal spray, Place 2 sprays into the nose daily as needed for allergies., Disp: , Rfl:  .  hydrochlorothiazide (MICROZIDE) 12.5 MG capsule, Take 12.5 mg by mouth daily., Disp: , Rfl:  .  ibandronate (BONIVA) 150 MG tablet, Take 150 mg by mouth every 30 (thirty) days. Take in the morning with a full glass of water, on an empty stomach, and do not take anything else by mouth or lie down for the next 30 min., Disp: , Rfl:  .  Lactobacillus (PROBIOTIC ACIDOPHILUS PO), Take 1 capsule by mouth daily., Disp: , Rfl:  .  LORazepam (ATIVAN) 1 MG tablet, Take 1 mg by mouth at bedtime as needed for sleep., Disp: , Rfl:  .  Omega-3 Fatty Acids (FISH OIL) 1000 MG CPDR, Take 1 capsule by mouth daily., Disp: , Rfl:  .  omeprazole (PRILOSEC) 40 MG capsule, Take 40 mg by mouth daily as needed (acid reflux)., Disp: , Rfl:  .  potassium chloride SA  (K-DUR,KLOR-CON) 20 MEQ tablet, Take 20 mEq by mouth daily., Disp: , Rfl:  .  traMADol (ULTRAM) 50 MG tablet, Take 1 every 4-6 hours as needed for pain not controlled by Tylenol, Disp: 6 tablet, Rfl: 0  Mr Tibia Fibula Right Wo Contrast  Result Date: 02/03/2019 CLINICAL DATA:  Persistent right knee pain radiating into the calf since fall in February. EXAM: MRI OF LOWER RIGHT EXTREMITY WITHOUT CONTRAST TECHNIQUE: Multiplanar, multisequence MR imaging of the right tibia and fibula was performed. No intravenous contrast was administered. COMPARISON:  None. FINDINGS: Bones/Joint/Cartilage Partially visualized patchy, symmetric T2 hyperintense, slightly T1 hyperintense marrow signal in the distal femoral metaphyses, most consistent with red marrow hyperplasia. No suspicious bone marrow signal abnormality. No fracture or dislocation. Normal alignment. Small right knee joint effusion. Ligaments Diminutive appearance of the right medial meniscus posterior root (series 16, image 19). Knee ligaments and menisci are otherwise grossly intact, although evaluation is limited due to large field of view. Muscles and Tendons Visualized tendons are intact. Faint, patchy edema within the proximal soleus and peroneal longus muscles. Small intramuscular lipoma versus focal fatty atrophy in the mid medial gastrocnemius muscle. Soft tissue No fluid collection or hematoma.  No soft tissue mass. IMPRESSION: 1. Diminutive appearance of the right medial meniscus posterior root could reflect a tear. Given limited evaluation due to large field of view, dedicated right knee MRI without contrast is recommended for further evaluation. 2. Faint, patchy edema within the right proximal soleus and peroneal longus muscles is nonspecific but could reflect early denervation. Electronically Signed   By: Titus Dubin M.D.   On: 02/03/2019 13:43    No images are attached to the encounter.   CMP Latest Ref Rng & Units 12/28/2017  Glucose 65 -  99 mg/dL 118(H)  BUN 6 - 20 mg/dL 13  Creatinine 0.44 - 1.00 mg/dL 0.71  Sodium 135 - 145 mmol/L 139  Potassium 3.5 - 5.1 mmol/L 3.1(L)  Chloride 101 - 111 mmol/L 105  CO2 22 - 32 mmol/L 27  Calcium 8.9 - 10.3 mg/dL 9.5   CBC Latest Ref Rng & Units 02/16/2019  WBC 4.0 - 10.5 K/uL 2.3(L)  Hemoglobin 12.0 - 15.0 g/dL 11.3(L)  Hematocrit 36.0 -  46.0 % 35.6(L)  Platelets 150 - 400 K/uL 124(L)     Observation/objective: Patient appears in no acute distress of her video visit today.  Breathing is nonlabored  Assessment and plan: Patient is a 73 year old female referred for leukopenia/neutropenia and mild thrombocytopenia  Results of blood work from 02/16/2019 were as follows: CBC showed white count of 2.3, H&H of 11.3/35.6 and a platelet count of 124.  B12 and folate levels were normal.  HIV and hepatitis C testing was negative.  TSH was normal.  ANA comprehensive panel showed mildly elevated IgG ENA antibody.  Reticulocyte count was normal.  Flow cytometry shows less than 1% lymphocytes that were CD10 positive clonal population.  Patient has had chronic waxing and waning leukopenia/neutropenia at least dating back to 2013.  Her white count is somewhat lower at this time as compared to her prior values which has been closer to 3.  Her platelet counts have been stable between 120s to 130s and she is not significantly anemic at this time.  I am inclined to monitor this conservatively without repeat bone marrow biopsy at this time.  If there is a consistent downward trend in her white count I will consider a bone marrow biopsy at that time.  Repeat CBC with differential and CMP in 4 weeks and 8 weeks and I will see her back in 8 weeks.  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  See MD in 8 weeks  I discussed the assessment and treatment plan with the patient. The patient was provided an opportunity to ask questions and all were answered. The patient  agreed with the plan and demonstrated an understanding of the instructions.   The patient was advised to call back or seek an in-person evaluation if the symptoms worsen or if the condition fails to improve as anticipated.    Visit Diagnosis: 1. Neutropenia, unspecified type ([place])   2. Thrombocytopenia ([place])     Dr. Randa Evens, MD, MPH [facility] at [facility] [phone] 02/21/2019 5:10 PM

## 2019-03-16 ENCOUNTER — Other Ambulatory Visit: Payer: Self-pay

## 2019-03-17 ENCOUNTER — Other Ambulatory Visit: Payer: Self-pay

## 2019-03-17 ENCOUNTER — Inpatient Hospital Stay: Payer: Medicare HMO | Attending: Oncology

## 2019-03-17 DIAGNOSIS — F419 Anxiety disorder, unspecified: Secondary | ICD-10-CM | POA: Insufficient documentation

## 2019-03-17 DIAGNOSIS — E7800 Pure hypercholesterolemia, unspecified: Secondary | ICD-10-CM | POA: Insufficient documentation

## 2019-03-17 DIAGNOSIS — D696 Thrombocytopenia, unspecified: Secondary | ICD-10-CM | POA: Diagnosis not present

## 2019-03-17 DIAGNOSIS — D72819 Decreased white blood cell count, unspecified: Secondary | ICD-10-CM | POA: Diagnosis not present

## 2019-03-17 DIAGNOSIS — Z79899 Other long term (current) drug therapy: Secondary | ICD-10-CM | POA: Diagnosis not present

## 2019-03-17 DIAGNOSIS — K219 Gastro-esophageal reflux disease without esophagitis: Secondary | ICD-10-CM | POA: Insufficient documentation

## 2019-03-17 DIAGNOSIS — Z87891 Personal history of nicotine dependence: Secondary | ICD-10-CM | POA: Diagnosis not present

## 2019-03-17 DIAGNOSIS — I10 Essential (primary) hypertension: Secondary | ICD-10-CM | POA: Diagnosis not present

## 2019-03-17 DIAGNOSIS — D709 Neutropenia, unspecified: Secondary | ICD-10-CM

## 2019-03-17 LAB — COMPREHENSIVE METABOLIC PANEL
ALT: 16 U/L (ref 0–44)
AST: 32 U/L (ref 15–41)
Albumin: 4.3 g/dL (ref 3.5–5.0)
Alkaline Phosphatase: 50 U/L (ref 38–126)
Anion gap: 15 (ref 5–15)
BUN: 17 mg/dL (ref 8–23)
CO2: 23 mmol/L (ref 22–32)
Calcium: 9.6 mg/dL (ref 8.9–10.3)
Chloride: 100 mmol/L (ref 98–111)
Creatinine, Ser: 0.74 mg/dL (ref 0.44–1.00)
GFR calc Af Amer: >60 mL/min (ref >60)
GFR calc non Af Amer: >60 mL/min (ref >60)
Glucose, Bld: 103 mg/dL — ABNORMAL HIGH (ref 70–99)
Potassium: 3.3 mmol/L — ABNORMAL LOW (ref 3.5–5.1)
Sodium: 138 mmol/L (ref 135–145)
Total Bilirubin: 0.6 mg/dL (ref 0.3–1.2)
Total Protein: 7.6 g/dL (ref 6.5–8.1)

## 2019-03-17 LAB — CBC WITH DIFFERENTIAL/PLATELET
Abs Immature Granulocytes: 0.02 10*3/uL (ref 0.00–0.07)
Basophils Absolute: 0 10*3/uL (ref 0.0–0.1)
Basophils Relative: 0 %
Eosinophils Absolute: 0.1 10*3/uL (ref 0.0–0.5)
Eosinophils Relative: 4 %
HCT: 37.1 % (ref 36.0–46.0)
Hemoglobin: 11.7 g/dL — ABNORMAL LOW (ref 12.0–15.0)
Immature Granulocytes: 1 %
Lymphocytes Relative: 42 %
Lymphs Abs: 1 10*3/uL (ref 0.7–4.0)
MCH: 27.5 pg (ref 26.0–34.0)
MCHC: 31.5 g/dL (ref 30.0–36.0)
MCV: 87.1 fL (ref 80.0–100.0)
Monocytes Absolute: 0.2 10*3/uL (ref 0.1–1.0)
Monocytes Relative: 7 %
Neutro Abs: 1.2 10*3/uL — ABNORMAL LOW (ref 1.7–7.7)
Neutrophils Relative %: 46 %
Platelets: 104 10*3/uL — ABNORMAL LOW (ref 150–400)
RBC: 4.26 MIL/uL (ref 3.87–5.11)
RDW: 16.5 % — ABNORMAL HIGH (ref 11.5–15.5)
WBC: 2.5 10*3/uL — ABNORMAL LOW (ref 4.0–10.5)
nRBC: 0.8 % — ABNORMAL HIGH (ref 0.0–0.2)

## 2019-04-18 ENCOUNTER — Ambulatory Visit: Payer: Medicare HMO | Admitting: Oncology

## 2019-04-18 ENCOUNTER — Other Ambulatory Visit: Payer: Medicare HMO

## 2019-04-25 ENCOUNTER — Inpatient Hospital Stay (HOSPITAL_BASED_OUTPATIENT_CLINIC_OR_DEPARTMENT_OTHER): Payer: Medicare HMO | Admitting: Oncology

## 2019-04-25 ENCOUNTER — Encounter: Payer: Self-pay | Admitting: Oncology

## 2019-04-25 ENCOUNTER — Other Ambulatory Visit: Payer: Self-pay

## 2019-04-25 ENCOUNTER — Inpatient Hospital Stay: Payer: Medicare HMO | Attending: Oncology

## 2019-04-25 VITALS — BP 126/75 | HR 71 | Temp 96.0°F | Wt 179.7 lb

## 2019-04-25 DIAGNOSIS — F419 Anxiety disorder, unspecified: Secondary | ICD-10-CM | POA: Insufficient documentation

## 2019-04-25 DIAGNOSIS — D61818 Other pancytopenia: Secondary | ICD-10-CM | POA: Diagnosis not present

## 2019-04-25 DIAGNOSIS — D696 Thrombocytopenia, unspecified: Secondary | ICD-10-CM

## 2019-04-25 DIAGNOSIS — D709 Neutropenia, unspecified: Secondary | ICD-10-CM

## 2019-04-25 DIAGNOSIS — Z87891 Personal history of nicotine dependence: Secondary | ICD-10-CM | POA: Insufficient documentation

## 2019-04-25 DIAGNOSIS — K219 Gastro-esophageal reflux disease without esophagitis: Secondary | ICD-10-CM | POA: Insufficient documentation

## 2019-04-25 DIAGNOSIS — Z79899 Other long term (current) drug therapy: Secondary | ICD-10-CM | POA: Diagnosis not present

## 2019-04-25 DIAGNOSIS — I10 Essential (primary) hypertension: Secondary | ICD-10-CM | POA: Insufficient documentation

## 2019-04-25 DIAGNOSIS — E7800 Pure hypercholesterolemia, unspecified: Secondary | ICD-10-CM | POA: Diagnosis not present

## 2019-04-25 DIAGNOSIS — C9100 Acute lymphoblastic leukemia not having achieved remission: Secondary | ICD-10-CM | POA: Insufficient documentation

## 2019-04-25 LAB — CBC WITH DIFFERENTIAL/PLATELET
Abs Immature Granulocytes: 0.03 10*3/uL (ref 0.00–0.07)
Basophils Absolute: 0 10*3/uL (ref 0.0–0.1)
Basophils Relative: 1 %
Eosinophils Absolute: 0.1 10*3/uL (ref 0.0–0.5)
Eosinophils Relative: 4 %
HCT: 35.4 % — ABNORMAL LOW (ref 36.0–46.0)
Hemoglobin: 11.4 g/dL — ABNORMAL LOW (ref 12.0–15.0)
Immature Granulocytes: 1 %
Lymphocytes Relative: 50 %
Lymphs Abs: 1.1 10*3/uL (ref 0.7–4.0)
MCH: 28.1 pg (ref 26.0–34.0)
MCHC: 32.2 g/dL (ref 30.0–36.0)
MCV: 87.4 fL (ref 80.0–100.0)
Monocytes Absolute: 0.1 10*3/uL (ref 0.1–1.0)
Monocytes Relative: 7 %
Neutro Abs: 0.8 10*3/uL — ABNORMAL LOW (ref 1.7–7.7)
Neutrophils Relative %: 37 %
Platelets: 64 10*3/uL — ABNORMAL LOW (ref 150–400)
RBC: 4.05 MIL/uL (ref 3.87–5.11)
RDW: 16.7 % — ABNORMAL HIGH (ref 11.5–15.5)
Smear Review: DECREASED
WBC Morphology: ABNORMAL
WBC: 2.2 10*3/uL — ABNORMAL LOW (ref 4.0–10.5)
nRBC: 1.4 % — ABNORMAL HIGH (ref 0.0–0.2)

## 2019-04-25 LAB — COMPREHENSIVE METABOLIC PANEL
ALT: 13 U/L (ref 0–44)
AST: 25 U/L (ref 15–41)
Albumin: 4.2 g/dL (ref 3.5–5.0)
Alkaline Phosphatase: 54 U/L (ref 38–126)
Anion gap: 19 — ABNORMAL HIGH (ref 5–15)
BUN: 20 mg/dL (ref 8–23)
CO2: 18 mmol/L — ABNORMAL LOW (ref 22–32)
Calcium: 9.6 mg/dL (ref 8.9–10.3)
Chloride: 101 mmol/L (ref 98–111)
Creatinine, Ser: 0.77 mg/dL (ref 0.44–1.00)
GFR calc Af Amer: >60 mL/min (ref >60)
GFR calc non Af Amer: >60 mL/min (ref >60)
Glucose, Bld: 137 mg/dL — ABNORMAL HIGH (ref 70–99)
Potassium: 3.6 mmol/L (ref 3.5–5.1)
Sodium: 138 mmol/L (ref 135–145)
Total Bilirubin: 0.6 mg/dL (ref 0.3–1.2)
Total Protein: 7.7 g/dL (ref 6.5–8.1)

## 2019-04-26 ENCOUNTER — Telehealth: Payer: Self-pay

## 2019-04-26 NOTE — Progress Notes (Signed)
Hematology/Oncology Consult note [facility]  Telephone:([phone] Fax:([phone]  Patient Care Team: Perrin Maltese, MD as PCP - General (Internal Medicine)   Name of the patient: Mercedes Vasquez  [id number]  1945-12-12   Date of visit: 04/26/19  Diagnosis-pancytopenia of unclear etiology  Chief complaint/ Reason for visit-routine follow-up of pancytopenia  Heme/Onc history: Patient is a 73 year old African-American female with a past medical history significant for hypertension and hypercholesterolemia.  She has been referred to [place] for leukopenia/thrombocytopenia. Most recent CBC from 01/14/2019 showed white count of 2.5, H&H of 12/37.3 with a platelet count 218.  Differential mainly shows neutropenia with an ANC of 1.0.  Looking back at her prior CBCs dating back to 2013 patient has had a waxing and waning white count ranging between 3.1-4.5 with an Helper as low as 1.3.  She has also had mild thrombocytopenia with platelet count between 120s to 140s.  Patient overall reports feeling well and denies any symptoms of recurrent infections fevers or hospitalizations.  Her appetite is good and her weight has remained stable.  She has been having some ongoing right knee pain for which she underwent an MRI recently which showed a tear in her right medial meniscus.  Patient reports that she is also seen hematology many years ago at [facility] health and underwent investigation for low white count and low platelet count which was essentially unremarkable.  No family history of any lymphoma or leukemia.  She reports having occasional night sweats when her head is soaked in sweat.  No personal history of any autoimmune disorders.  She denies any joint pain joint swelling or skin rash.  Denies any over-the-counter medications or herbal supplements.  Flow cytometry in April 2020 showed:  PATH INTERP XXX-IMP cell population, <1% of leukocytes, <5000/uL See Comment   Comment: Comment   CD10+ clonal B   ANNOTATION COMMENT IMP Comment VC   Comment: (NOTE)  CD10 is most often expressed in lymphomas of follicular center cell  origin.  If the patient is not known to have lymphoma, further evaluation is  recommended if clinically indicated.  This phenotype is unusual because there is little to no expression of  CD20  and the patient has reportedly not been treated with anti-CD20  therapy.  The very bright CD38 expression is also unusual.  Repeat phenotyping of a bone marrow aspirate is recommended if  clinically  indicated.     Interval history- she has mild fatigue but denies other complaints. Appetite and weight have remained stable  ECOG PS- 1 Pain scale- 0   Review of systems- Review of Systems  Constitutional: Positive for malaise/fatigue. Negative for chills, fever and weight loss.  HENT: Negative for congestion, ear discharge and nosebleeds.   Eyes: Negative for blurred vision.  Respiratory: Negative for cough, hemoptysis, sputum production, shortness of breath and wheezing.   Cardiovascular: Negative for chest pain, palpitations, orthopnea and claudication.  Gastrointestinal: Negative for abdominal pain, blood in stool, constipation, diarrhea, heartburn, melena, nausea and vomiting.  Genitourinary: Negative for dysuria, flank pain, frequency, hematuria and urgency.  Musculoskeletal: Negative for back pain, joint pain and myalgias.  Skin: Negative for rash.  Neurological: Negative for dizziness, tingling, focal weakness, seizures, weakness and headaches.  Endo/Heme/Allergies: Does not bruise/bleed easily.  Psychiatric/Behavioral: Negative for depression and suicidal ideas. The patient does not have insomnia.        Allergies  Allergen Reactions  . Ace Inhibitors Swelling    enalapril  Past Medical History:  Diagnosis Date  . Anemia   . Anxiety   . GERD (gastroesophageal reflux disease)   . Heart murmur   . Hypercholesteremia   .  Hypertension    controlled on meds  . Osteoporosis   . Wears dentures    lower     Past Surgical History:  Procedure Laterality Date  . ABDOMINAL HYSTERECTOMY    . BROW LIFT Bilateral 03/09/2018   Procedure: BLEPHAROPLASTY REPAIR RESECT;  Surgeon: Karle Starch, MD;  Location: [place];  Service: Ophthalmology;  Laterality: Bilateral;  . CATARACT EXTRACTION W/PHACO Right 10/29/2016   Procedure: CATARACT EXTRACTION PHACO AND INTRAOCULAR LENS PLACEMENT (IOC);  Surgeon: Estill Cotta, MD;  Location: [facility];  Service: Ophthalmology;  Laterality: Right;  [place]  01:04 AP% 24.1 CDE 28.71 fluid pack lot # [id number]  . COLONOSCOPY    . FOOT SURGERY     bunion  . PTOSIS REPAIR Bilateral 03/09/2018   Procedure: PTOSIS REPAIR;  Surgeon: Karle Starch, MD;  Location: [place];  Service: Ophthalmology;  Laterality: Bilateral;    Social History   Socioeconomic History  . Marital status: Married    Spouse name: Not on file  . Number of children: Not on file  . Years of education: Not on file  . Highest education level: Not on file  Occupational History  . Not on file  Social Needs  . Financial resource strain: Not on file  . Food insecurity    Worry: Not on file    Inability: Not on file  . Transportation needs    Medical: Not on file    Non-medical: Not on file  Tobacco Use  . Smoking status: Former Smoker    Types: Cigarettes    Quit date: 09/19/1969    Years since quitting: 49.6  . Smokeless tobacco: Never Used  Substance and Sexual Activity  . Alcohol use: No  . Drug use: No  . Sexual activity: Not Currently  Lifestyle  . Physical activity    Days per week: Not on file    Minutes per session: Not on file  . Stress: Not on file  Relationships  . Social Herbalist on phone: Not on file    Gets together: Not on file    Attends religious service: Not on file    Active member of club or organization: Not on file    Attends meetings of clubs  or organizations: Not on file    Relationship status: Not on file  . Intimate partner violence    Fear of current or ex partner: Not on file    Emotionally abused: Not on file    Physically abused: Not on file    Forced sexual activity: Not on file  Other Topics Concern  . Not on file  Social History Narrative  . Not on file    Family History  Problem Relation Age of Onset  . Pancreatic cancer Mother   . Heart attack Maternal Aunt   . Lung cancer Cousin   . Lung cancer Cousin   . Breast cancer Neg Hx      Current Outpatient Medications:  .  amLODipine (NORVASC) 5 MG tablet, Take 5 mg by mouth daily. 11am, Disp: , Rfl:  .  atorvastatin (LIPITOR) 20 MG tablet, Take 20 mg by mouth daily. 11am, Disp: , Rfl:  .  B Complex Vitamins (B COMPLEX PO), Take 1 Dose by mouth daily., Disp: , Rfl:  .  cetirizine (ZYRTEC) 10 MG tablet, Take 10 mg by mouth daily., Disp: , Rfl:  .  citalopram (CELEXA) 20 MG tablet, Take 20 mg by mouth daily., Disp: , Rfl:  .  docusate sodium (COLACE) 100 MG capsule, Take 1 tablet once or twice daily as needed for constipation while taking narcotic pain medicine (Patient taking differently: Take 100 mg by mouth daily as needed (constipation). ), Disp: 30 capsule, Rfl: 0 .  ergocalciferol (VITAMIN D2) 1.25 MG (50000 UT) capsule, Take 50,000 Units by mouth once a week., Disp: , Rfl:  .  Ferrous Fumarate-Folic Acid (HEMATINIC/FOLIC ACID PO), Take 1 tablet by mouth daily., Disp: , Rfl:  .  fluticasone (FLONASE) 50 MCG/ACT nasal spray, Place 2 sprays into the nose daily as needed for allergies., Disp: , Rfl:  .  folic acid (FOLVITE) 1 MG tablet, , Disp: , Rfl:  .  gabapentin (NEURONTIN) 100 MG capsule, Take 100 mg by mouth 2 (two) times daily., Disp: , Rfl:  .  hydrochlorothiazide (MICROZIDE) 12.5 MG capsule, Take 12.5 mg by mouth daily., Disp: , Rfl:  .  ibandronate (BONIVA) 150 MG tablet, Take 150 mg by mouth every 30 (thirty) days. Take in the morning with a full  glass of water, on an empty stomach, and do not take anything else by mouth or lie down for the next 30 min., Disp: , Rfl:  .  Lactobacillus (PROBIOTIC ACIDOPHILUS PO), Take 1 capsule by mouth daily., Disp: , Rfl:  .  LORazepam (ATIVAN) 1 MG tablet, Take 1 mg by mouth at bedtime as needed for sleep., Disp: , Rfl:  .  Omega-3 Fatty Acids (FISH OIL) 1000 MG CPDR, Take 1 capsule by mouth daily., Disp: , Rfl:  .  omeprazole (PRILOSEC) 40 MG capsule, Take 40 mg by mouth daily as needed (acid reflux)., Disp: , Rfl:  .  potassium chloride SA (K-DUR,KLOR-CON) 20 MEQ tablet, Take 20 mEq by mouth daily., Disp: , Rfl:  .  traMADol (ULTRAM) 50 MG tablet, Take 1 every 4-6 hours as needed for pain not controlled by Tylenol, Disp: 6 tablet, Rfl: 0  Physical exam:  Vitals:   04/25/19 1422  BP: 126/75  Pulse: 71  Temp: (!) 96 F (35.6 C)  TempSrc: Tympanic  Weight: 179 lb 11.2 oz (81.5 kg)   Physical Exam Constitutional:      General: She is not in acute distress. HENT:     Head: Normocephalic and atraumatic.  Eyes:     Pupils: Pupils are equal, round, and reactive to light.  Neck:     Musculoskeletal: Normal range of motion.  Cardiovascular:     Rate and Rhythm: Normal rate and regular rhythm.     Heart sounds: Normal heart sounds.  Pulmonary:     Effort: Pulmonary effort is normal.     Breath sounds: Normal breath sounds.  Abdominal:     General: Bowel sounds are normal.     Palpations: Abdomen is soft.  Skin:    General: Skin is warm and dry.  Neurological:     Mental Status: She is alert and oriented to person, place, and time.      CMP Latest Ref Rng & Units 04/25/2019  Glucose 70 - 99 mg/dL 137(H)  BUN 8 - 23 mg/dL 20  Creatinine 0.44 - 1.00 mg/dL 0.77  Sodium 135 - 145 mmol/L 138  Potassium 3.5 - 5.1 mmol/L 3.6  Chloride 98 - 111 mmol/L 101  CO2 22 - 32 mmol/L 18(L)  Calcium  8.9 - 10.3 mg/dL 9.6  Total Protein 6.5 - 8.1 g/dL 7.7  Total Bilirubin 0.3 - 1.2 mg/dL 0.6  Alkaline  Phos 38 - 126 U/L 54  AST 15 - 41 U/L 25  ALT 0 - 44 U/L 13   CBC Latest Ref Rng & Units 04/25/2019  WBC 4.0 - 10.5 K/uL 2.2(L)  Hemoglobin 12.0 - 15.0 g/dL 11.4(L)  Hematocrit 36.0 - 46.0 % 35.4(L)  Platelets 150 - 400 K/uL 64(L)     Assessment and plan- Patient is a 73 y.o. female referred for pancytopenia  Patient has had mild intermittent leukopenia in the past with a wbc of 3.4-3.5. more recently since April 2020 her wbc has drifted down to 2.2. anc 0.8. thrombocytopenia is also worse and down to 64 today from 104 in may and 124 in April 2020. Hb mildly low at 11. Given presence of more than 1 cytopenia and a downward trend, I would like to get a bone marrow biopsy to rule out primary marrow process such as MDS or leukemia.  I will tentatively see her back in 2 weeks time after bone marrow biopsy results are back.  I have discussed all this with patients daughter as well   Visit Diagnosis 1. Other pancytopenia ([place])      Dr. Randa Evens, MD, MPH [facility] [phone] 04/26/2019 9:16 AM

## 2019-04-26 NOTE — Telephone Encounter (Signed)
Contacted patient and informed her of her bone marrow biopsy procedure date/time.  Provided her instructions regarding arrival time, locations, and NPO status prior to procedure.  She denies any questions at this time.

## 2019-05-05 ENCOUNTER — Other Ambulatory Visit: Payer: Self-pay | Admitting: Radiology

## 2019-05-06 ENCOUNTER — Ambulatory Visit
Admission: RE | Admit: 2019-05-06 | Discharge: 2019-05-06 | Disposition: A | Discharge status: Home / Self Care | Payer: Medicare HMO | Source: Ambulatory Visit | Attending: Oncology | Admitting: Oncology

## 2019-05-06 ENCOUNTER — Other Ambulatory Visit: Payer: Self-pay

## 2019-05-06 ENCOUNTER — Other Ambulatory Visit (HOSPITAL_COMMUNITY)
Admission: RE | Admit: 2019-05-06 | Disposition: A | Discharge status: Home / Self Care | Payer: Medicare HMO | Source: Ambulatory Visit | Attending: Oncology | Admitting: Oncology

## 2019-05-06 DIAGNOSIS — F419 Anxiety disorder, unspecified: Secondary | ICD-10-CM | POA: Insufficient documentation

## 2019-05-06 DIAGNOSIS — Z800 Family history of malignant neoplasm of digestive organs: Secondary | ICD-10-CM | POA: Diagnosis not present

## 2019-05-06 DIAGNOSIS — K219 Gastro-esophageal reflux disease without esophagitis: Secondary | ICD-10-CM | POA: Diagnosis not present

## 2019-05-06 DIAGNOSIS — Z79899 Other long term (current) drug therapy: Secondary | ICD-10-CM | POA: Insufficient documentation

## 2019-05-06 DIAGNOSIS — I10 Essential (primary) hypertension: Secondary | ICD-10-CM | POA: Diagnosis not present

## 2019-05-06 DIAGNOSIS — M810 Age-related osteoporosis without current pathological fracture: Secondary | ICD-10-CM | POA: Diagnosis not present

## 2019-05-06 DIAGNOSIS — E785 Hyperlipidemia, unspecified: Secondary | ICD-10-CM | POA: Insufficient documentation

## 2019-05-06 DIAGNOSIS — D61818 Other pancytopenia: Secondary | ICD-10-CM | POA: Insufficient documentation

## 2019-05-06 DIAGNOSIS — Z87891 Personal history of nicotine dependence: Secondary | ICD-10-CM | POA: Diagnosis not present

## 2019-05-06 DIAGNOSIS — Z888 Allergy status to other drugs, medicaments and biological substances status: Secondary | ICD-10-CM | POA: Diagnosis not present

## 2019-05-06 LAB — CBC WITH DIFFERENTIAL/PLATELET
Abs Immature Granulocytes: 0.03 10*3/uL (ref 0.00–0.07)
Basophils Absolute: 0 10*3/uL (ref 0.0–0.1)
Basophils Relative: 1 %
Eosinophils Absolute: 0.1 10*3/uL (ref 0.0–0.5)
Eosinophils Relative: 3 %
HCT: 34.5 % — ABNORMAL LOW (ref 36.0–46.0)
Hemoglobin: 11.2 g/dL — ABNORMAL LOW (ref 12.0–15.0)
Immature Granulocytes: 2 %
Lymphocytes Relative: 53 %
Lymphs Abs: 1 10*3/uL (ref 0.7–4.0)
MCH: 28.4 pg (ref 26.0–34.0)
MCHC: 32.5 g/dL (ref 30.0–36.0)
MCV: 87.6 fL (ref 80.0–100.0)
Monocytes Absolute: 0.1 10*3/uL (ref 0.1–1.0)
Monocytes Relative: 4 %
Neutro Abs: 0.7 10*3/uL — ABNORMAL LOW (ref 1.7–7.7)
Neutrophils Relative %: 37 %
Platelets: 90 10*3/uL — ABNORMAL LOW (ref 150–400)
RBC: 3.94 MIL/uL (ref 3.87–5.11)
RDW: 16 % — ABNORMAL HIGH (ref 11.5–15.5)
Smear Review: DECREASED
WBC: 1.9 10*3/uL — ABNORMAL LOW (ref 4.0–10.5)
nRBC: 1.1 % — ABNORMAL HIGH (ref 0.0–0.2)

## 2019-05-06 LAB — PROTIME-INR
INR: 0.9 (ref 0.8–1.2)
Prothrombin Time: 12.5 seconds (ref 11.4–15.2)

## 2019-05-06 MED ORDER — FENTANYL CITRATE (PF) 100 MCG/2ML IJ SOLN
INTRAMUSCULAR | Status: AC
  Filled 2019-05-06: qty 4

## 2019-05-06 MED ORDER — FENTANYL CITRATE (PF) 100 MCG/2ML IJ SOLN
INTRAMUSCULAR | Status: AC | PRN
  Administered 2019-05-06: 50 ug via INTRAVENOUS
  Administered 2019-05-06 (×2): 25 ug via INTRAVENOUS

## 2019-05-06 MED ORDER — MIDAZOLAM HCL 5 MG/5ML IJ SOLN
INTRAMUSCULAR | Status: AC
  Filled 2019-05-06: qty 5

## 2019-05-06 MED ORDER — HEPARIN SOD (PORK) LOCK FLUSH 100 UNIT/ML IV SOLN
INTRAVENOUS | Status: AC
  Filled 2019-05-06: qty 5

## 2019-05-06 MED ORDER — SODIUM CHLORIDE 0.9 % IV SOLN
INTRAVENOUS | Status: DC
  Administered 2019-05-06: 08:00:00 via INTRAVENOUS

## 2019-05-06 MED ORDER — MIDAZOLAM HCL 5 MG/5ML IJ SOLN
INTRAMUSCULAR | Status: AC | PRN
  Administered 2019-05-06 (×2): 1 mg via INTRAVENOUS

## 2019-05-06 NOTE — Progress Notes (Signed)
Small hematoma noted to the left hand prior to IV removal. Hematoma was expressed successfully. Patient did not complain of any pain or discomfort. Hematoma only noted to be present on the top of the left hand. Wrapped with coban for pressure.

## 2019-05-06 NOTE — Consult Note (Signed)
Chief Complaint: Pancytopenia  Referring Physician(s): Rao,Archana C  Patient Status: [facility] - Out-pt  History of Present Illness: Mercedes Vasquez is a 73 y.o. female with past medical history significant for hyperlipidemia, hypertension, heart murmur and osteoporosis who presents today for CT-guided bone marrow biopsy for the work-up of pancytopenia.  The patient is unaccompanied and serves as her own historian  Patient is currently without complaint.  Specifically, no fatigue, unintentional weight gain or loss.  No chest pain, fever or chills.  No shortness of breath.  Past Medical History:  Diagnosis Date  . Anemia   . Anxiety   . GERD (gastroesophageal reflux disease)   . Heart murmur   . Hypercholesteremia   . Hypertension    controlled on meds  . Osteoporosis   . Wears dentures    lower    Past Surgical History:  Procedure Laterality Date  . ABDOMINAL HYSTERECTOMY    . BROW LIFT Bilateral 03/09/2018   Procedure: BLEPHAROPLASTY REPAIR RESECT;  Surgeon: Karle Starch, MD;  Location: [place];  Service: Ophthalmology;  Laterality: Bilateral;  . CATARACT EXTRACTION W/PHACO Right 10/29/2016   Procedure: CATARACT EXTRACTION PHACO AND INTRAOCULAR LENS PLACEMENT (IOC);  Surgeon: Estill Cotta, MD;  Location: [facility];  Service: Ophthalmology;  Laterality: Right;  [place]  01:04 AP% 24.1 CDE 28.71 fluid pack lot # [id number]  . COLONOSCOPY    . FOOT SURGERY     bunion  . PTOSIS REPAIR Bilateral 03/09/2018   Procedure: PTOSIS REPAIR;  Surgeon: Karle Starch, MD;  Location: [place];  Service: Ophthalmology;  Laterality: Bilateral;    Allergies: Ace inhibitors  Medications: Prior to Admission medications   Medication Sig Start Date End Date Taking? Authorizing Provider  amLODipine (NORVASC) 5 MG tablet Take 5 mg by mouth daily. 11am   Yes [provider]  atorvastatin (LIPITOR) 20 MG tablet Take 20 mg by mouth daily. 11am   Yes [provider]  B Complex Vitamins (B COMPLEX PO) Take 1 Dose by mouth daily.   Yes [provider]  cetirizine (ZYRTEC) 10 MG tablet Take 10 mg by mouth daily.   Yes [provider]  citalopram (CELEXA) 20 MG tablet Take 20 mg by mouth daily.   Yes [provider]  docusate sodium (COLACE) 100 MG capsule Take 1 tablet once or twice daily as needed for constipation while taking narcotic pain medicine Patient taking differently: Take 100 mg by mouth daily as needed (constipation).  06/22/16  Yes Hinda Kehr, MD  ergocalciferol (VITAMIN D2) 1.25 MG (50000 UT) capsule Take 50,000 Units by mouth once a week.   Yes [provider]  Ferrous Fumarate-Folic Acid (HEMATINIC/FOLIC ACID PO) Take 1 tablet by mouth daily.   Yes [provider]  fluticasone (FLONASE) 50 MCG/ACT nasal spray Place 2 sprays into the nose daily as needed for allergies.   Yes [provider]  folic acid (FOLVITE) 1 MG tablet  04/19/19  Yes [provider]  hydrochlorothiazide (MICROZIDE) 12.5 MG capsule Take 12.5 mg by mouth daily.   Yes [provider]  Lactobacillus (PROBIOTIC ACIDOPHILUS PO) Take 1 capsule by mouth daily.   Yes [provider]  Omega-3 Fatty Acids (FISH OIL) 1000 MG CPDR Take 1 capsule by mouth daily.   Yes [provider]  potassium chloride SA (K-DUR,KLOR-CON) 20 MEQ tablet Take 20 mEq by mouth daily.   Yes [provider]  traMADol (ULTRAM) 50 MG tablet Take 1 every  4-6 hours as needed for pain not controlled by Tylenol 03/09/18  Yes Karle Starch, MD  gabapentin (NEURONTIN) 100 MG capsule Take 100 mg by mouth 2 (two) times daily. 01/27/19   [provider]  ibandronate (BONIVA) 150 MG tablet Take 150 mg by mouth every 30 (thirty) days. Take in the morning with a full glass of water, on an empty stomach, and do not take anything else by mouth or lie down for the next 30 min.    [provider]   LORazepam (ATIVAN) 1 MG tablet Take 1 mg by mouth at bedtime as needed for sleep.    [provider]  omeprazole (PRILOSEC) 40 MG capsule Take 40 mg by mouth daily as needed (acid reflux).    [provider]     Family History  Problem Relation Age of Onset  . Pancreatic cancer Mother   . Heart attack Maternal Aunt   . Lung cancer Cousin   . Lung cancer Cousin   . Breast cancer Neg Hx     Social History   Socioeconomic History  . Marital status: Married    Spouse name: Not on file  . Number of children: 2  . Years of education: Not on file  . Highest education level: Not on file  Occupational History  . Not on file  Social Needs  . Financial resource strain: Not hard at all  . Food insecurity    Worry: Never true    Inability: Never true  . Transportation needs    Medical: No    Non-medical: No  Tobacco Use  . Smoking status: Former Smoker    Types: Cigarettes    Quit date: 09/19/1969    Years since quitting: 49.6  . Smokeless tobacco: Never Used  Substance and Sexual Activity  . Alcohol use: No  . Drug use: No  . Sexual activity: Not Currently  Lifestyle  . Physical activity    Days per week: Not on file    Minutes per session: Not on file  . Stress: To some extent  Relationships  . Social connections    Talks on phone: More than three times a week    Gets together: More than three times a week    Attends religious service: More than 4 times per year    Active member of club or organization: Yes    Attends meetings of clubs or organizations: More than 4 times per year    Relationship status: Married  Other Topics Concern  . Not on file  Social History Narrative  . Not on file    ECOG Status: 0 - Asymptomatic  Review of Systems: A 12 point ROS discussed and pertinent positives are indicated in the HPI above.  All other systems are negative.  Review of Systems  Constitutional: Negative for activity change, chills, fatigue and fever.   Respiratory: Negative.   Cardiovascular: Negative.     Vital Signs: BP (!) 143/73   Pulse 73   Temp 98.6 F (37 C) (Oral)   Resp 18   Ht _0  (1.626 m)   Wt 79.4 kg   SpO2 100%   BMI 30.04 kg/m   Physical Exam Vitals signs and nursing note reviewed.  Constitutional:      Appearance: Normal appearance.  HENT:     Head: Normocephalic and atraumatic.  Cardiovascular:     Rate and Rhythm: Normal rate and regular rhythm.  Neurological:     Mental Status: She  is alert.  Psychiatric:        Mood and Affect: Mood normal.        Behavior: Behavior normal.        Thought Content: Thought content normal.     Imaging: No results found.  Labs:  CBC: Recent Labs    02/16/19 1001 03/17/19 1411 04/25/19 1358  WBC 2.3* 2.5* 2.2*  HGB 11.3* 11.7* 11.4*  HCT 35.6* 37.1 35.4*  PLT 124* 104* 64*    COAGS: Recent Labs    05/06/19 0738  INR 0.9    BMP: Recent Labs    03/17/19 1411 04/25/19 1358  NA 138 138  K 3.3* 3.6  CL 100 101  CO2 23 18*  GLUCOSE 103* 137*  BUN 17 20  CALCIUM 9.6 9.6  CREATININE 0.74 0.77  GFRNONAA >60 >60  GFRAA >60 >60    LIVER FUNCTION TESTS: Recent Labs    03/17/19 1411 04/25/19 1358  BILITOT 0.6 0.6  AST 32 25  ALT 16 13  ALKPHOS 50 54  PROT 7.6 7.7  ALBUMIN 4.3 4.2    TUMOR MARKERS: No results for input(s): AFPTM, CEA, CA199, CHROMGRNA in the last 8760 hours.  Assessment and Plan:  Mercedes Vasquez is a 73 y.o. female with past medical history significant for hyperlipidemia, hypertension, heart murmur and osteoporosis who presents today for CT-guided bone marrow biopsy for the work-up of pancytopenia.  The patient is unaccompanied and serves as her own historian  Patient is currently without complaint.    Risks and benefits of CT-guided bone marrow biopsy and aspiration was discussed with the patient and/or patient's family including, but not limited to bleeding, infection, damage to adjacent structures or low yield  requiring additional tests.  All of the questions were answered and there is agreement to proceed.  Consent signed and in chart.   Thank you for this interesting consult.  I greatly enjoyed meeting Mercedes Vasquez and look forward to participating in their care.  A copy of this report was sent to the requesting provider on this date.  Electronically Signed: Sandi Mariscal, MD 05/06/2019, 8:16 AM   I spent a total of 15 Minutes in face to face in clinical consultation, greater than 50% of which was counseling/coordinating care for CT-guided bone marrow biopsy and aspiration

## 2019-05-06 NOTE — Procedures (Signed)
Pre-procedure Diagnosis: Pancytopenia Post-procedure Diagnosis: Same  Technically successful CT guided bone marrow aspiration and biopsy of left iliac crest.   Complications: None Immediate  EBL: None  Signed: Sandi Mariscal Pager: [phone] 05/06/2019, 9:14 AM

## 2019-05-06 NOTE — Discharge Instructions (Signed)
Bone Marrow Aspiration and Bone Marrow Biopsy, Adult, Care After °This sheet gives you information about how to care for yourself after your procedure. Your health care provider may also give you more specific instructions. If you have problems or questions, contact your health care provider. °What can I expect after the procedure? °After the procedure, it is common to have: °· Mild pain and tenderness. °· Swelling. °· Bruising. °Follow these instructions at home: °Puncture site care ° °  ° °· Follow instructions from your health care provider about how to take care of the puncture site. Make sure you: °? Wash your hands with soap and water before you change your bandage (dressing). If soap and water are not available, use hand sanitizer. °? Change your dressing as told by your health care provider. °· Check your puncture site every day for signs of infection. Check for: °? More redness, swelling, or pain. °? More fluid or blood. °? Warmth. °? Pus or a bad smell. °General instructions °· Take over-the-counter and prescription medicines only as told by your health care provider. °· Do not take baths, swim, or use a hot tub until your health care provider approves. Ask if you can take a shower or have a sponge bath. °· Return to your normal activities as told by your health care provider. Ask your health care provider what activities are safe for you. °· Do not drive for 24 hours if you were given a medicine to help you relax (sedative) during your procedure. °· Keep all follow-up visits as told by your health care provider. This is important. °Contact a health care provider if: °· Your pain is not controlled with medicine. °Get help right away if: °· You have a fever. °· You have more redness, swelling, or pain around the puncture site. °· You have more fluid or blood coming from the puncture site. °· Your puncture site feels warm to the touch. °· You have pus or a bad smell coming from the puncture site. °These  symptoms may represent a serious problem that is an emergency. Do not wait to see if the symptoms will go away. Get medical help right away. Call your local emergency services (911 in the [place] Do not drive yourself to the hospital. °Summary °· After the procedure, it is common to have mild pain, tenderness, swelling, and bruising. °· Follow instructions from your health care provider about how to take care of the puncture site. °· Get help right away if you have any symptoms of infection or if you have more blood or fluid coming from the puncture site. °This information is not intended to replace advice given to you by your health care provider. Make sure you discuss any questions you have with your health care provider. °Document Released: 04/25/2005 Document Revised: 01/19/2018 Document Reviewed: 03/19/2016 °Elsevier Patient Education © 2020 Elsevier Inc. ° ° ° ° °Moderate Conscious Sedation, Adult, Care After °These instructions provide you with information about caring for yourself after your procedure. Your health care provider may also give you more specific instructions. Your treatment has been planned according to current medical practices, but problems sometimes occur. Call your health care provider if you have any problems or questions after your procedure. °What can I expect after the procedure? °After your procedure, it is common: °· To feel sleepy for several hours. °· To feel clumsy and have poor balance for several hours. °· To have poor judgment for several hours. °· To vomit if you eat too soon. °  Follow these instructions at home: °For at least 24 hours after the procedure: ° °· Do not: °? Participate in activities where you could fall or become injured. °? Drive. °? Use heavy machinery. °? Drink alcohol. °? Take sleeping pills or medicines that cause drowsiness. °? Make important decisions or sign legal documents. °? Take care of children on your own. °· Rest. °Eating and drinking °· Follow the  diet recommended by your health care provider. °· If you vomit: °? Drink water, juice, or soup when you can drink without vomiting. °? Make sure you have little or no nausea before eating solid foods. °General instructions °· Have a responsible adult stay with you until you are awake and alert. °· Take over-the-counter and prescription medicines only as told by your health care provider. °· If you smoke, do not smoke without supervision. °· Keep all follow-up visits as told by your health care provider. This is important. °Contact a health care provider if: °· You keep feeling nauseous or you keep vomiting. °· You feel light-headed. °· You develop a rash. °· You have a fever. °Get help right away if: °· You have trouble breathing. °This information is not intended to replace advice given to you by your health care provider. Make sure you discuss any questions you have with your health care provider. °Document Released: 07/27/2013 Document Revised: 09/18/2017 Document Reviewed: 01/26/2016 °Elsevier Patient Education © 2020 Elsevier Inc. ° °

## 2019-05-12 ENCOUNTER — Encounter (INDEPENDENT_AMBULATORY_CARE_PROVIDER_SITE_OTHER): Payer: Self-pay

## 2019-05-12 ENCOUNTER — Inpatient Hospital Stay (HOSPITAL_BASED_OUTPATIENT_CLINIC_OR_DEPARTMENT_OTHER): Payer: Medicare HMO | Admitting: Oncology

## 2019-05-12 ENCOUNTER — Encounter: Payer: Self-pay | Admitting: Oncology

## 2019-05-12 ENCOUNTER — Encounter (HOSPITAL_COMMUNITY): Payer: Self-pay | Admitting: Oncology

## 2019-05-12 ENCOUNTER — Other Ambulatory Visit: Payer: Self-pay

## 2019-05-12 DIAGNOSIS — C9100 Acute lymphoblastic leukemia not having achieved remission: Secondary | ICD-10-CM | POA: Diagnosis not present

## 2019-05-12 NOTE — Progress Notes (Signed)
Pt was added on for video visit to have md go over bone marrow results. The patient has no pain and she is going over to daughter house so they can hear what md says to both of them

## 2019-05-13 NOTE — Progress Notes (Signed)
I connected with Mercedes Vasquez on 05/13/19 at 11:30 AM EDT by video enabled telemedicine visit and verified that I am speaking with the correct person using two identifiers.   I discussed the limitations, risks, security and privacy concerns of performing an evaluation and management service by telemedicine and the availability of in-person appointments. I also discussed with the patient that there may be a patient responsible charge related to this service. The patient expressed understanding and agreed to proceed.  Other persons participating in the visit and their role in the encounter:  Patients daughter  Patient's location:  home Provider's location:  work  Risk analyst Complaint: Discuss bone marrow biopsy results and further management  History of present illness: Patient is a 73 year old African-American female with a past medical history significant for hypertension and hypercholesterolemia. She has been referred to [place] for leukopenia/thrombocytopenia. Most recent CBC from 01/14/2019 showed white count of 2.5, H&H of 12/37.3 with a platelet count 218. Differential mainly shows neutropenia with an ANC of 1.0. Looking back at her prior CBCs dating back to 2013 patient has had a waxing and waning white count ranging between 3.1-4.5 with an [place] as low as 1.3. She has also had mild thrombocytopenia with platelet count between 120s to 140s. Patient overall reports feeling well and denies any symptoms of recurrent infections fevers or hospitalizations. Her appetite is good and her weight has remained stable. She has been having some ongoing right knee pain for which she underwent an MRI recently which showed a tear in her right medial meniscus. Patient reports that she is also seen hematology many years ago at [facility] and underwent investigation for low white count and low platelet count which was essentially unremarkable. No family history of any lymphoma or leukemia. She reports having occasional  night sweats when her head is soaked in sweat.No personal history of any autoimmune disorders. She denies any joint pain joint swelling or skin rash. Denies any over-the-counter medications or herbal supplements.  Flow cytometry in April 2020 showed:  PATH INTERP XXX-IMP cell population, <1% of leukocytes, <5000/uL See Comment   Comment: Comment  CD10+ clonal B   ANNOTATION COMMENT IMP Comment VC   Comment: (NOTE)  CD10 is most often expressed in lymphomas of follicular center cell  origin.  If the patient is not known to have lymphoma, further evaluation is  recommended if clinically indicated.  This phenotype is unusual because there is little to no expression of  CD20  and the patient has reportedly not been treated with anti-CD20  therapy.  The very bright CD38 expression is also unusual.  Repeat phenotyping of a bone marrow aspirate is recommended if  clinically  indicated.     Her repeat counts in May 2020 remain low and I had recommended a virtual visit to discuss bone marrow biopsy with the patient which the patient did not follow through.  She then had a repeat CBC done on 04/25/2019 which showed white count of 2.2 , H&H of 11.4/35.4 with a platelet count of 64 and a bone marrow biopsy was recommended at that time.  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  Marrow is replaced by abnormal B cells.  All closing phenotype with strong surface light chain by flow cytometry suggestive of maturity and TdT expression by IHC suggestive of prematurity.  No CD20 or CD34 expression.  This could be B-cell lymphoblastic leukemia/lymphoma or a high-grade B-cell lymphoma although the morphology slightly favors B-cell lymphoblastic leukemia.  [place] for  9322, BCL 6, BCL-2 and translocation 1418 is currently pending.  Bone marrow was hypercellular for 800% with sheets of atypical mononuclear cells by IHC cells are positive for CD45, PAX 5, CD79a, CD10, CD138 BCL-2 and BCL 6 very weak  and TdT weak.  They are negative for CD34, CD20, CD3, CD 117, CD5, CD19, CD38, CD56, HHV 8, EBV, EMA, alk and light chains by FISH negative.  Ki-67 50%  Interval history : Patient currently reports some fatigue but feels well overall and denies other complaints at this time   Review of Systems  Constitutional: Positive for malaise/fatigue. Negative for chills, fever and weight loss.  HENT: Negative for congestion, ear discharge and nosebleeds.   Eyes: Negative for blurred vision.  Respiratory: Negative for cough, hemoptysis, sputum production, shortness of breath and wheezing.   Cardiovascular: Negative for chest pain, palpitations, orthopnea and claudication.  Gastrointestinal: Negative for abdominal pain, blood in stool, constipation, diarrhea, heartburn, melena, nausea and vomiting.  Genitourinary: Negative for dysuria, flank pain, frequency, hematuria and urgency.  Musculoskeletal: Negative for back pain, joint pain and myalgias.  Skin: Negative for rash.  Neurological: Negative for dizziness, tingling, focal weakness, seizures, weakness and headaches.  Endo/Heme/Allergies: Does not bruise/bleed easily.  Psychiatric/Behavioral: Negative for depression and suicidal ideas. The patient does not have insomnia.     Allergies  Allergen Reactions  . Ace Inhibitors Swelling    enalapril    Past Medical History:  Diagnosis Date  . Anemia   . Anxiety   . GERD (gastroesophageal reflux disease)   . Heart murmur   . Hypercholesteremia   . Hypertension    controlled on meds  . Osteoporosis   . Pancytopenia ([place])   . Wears dentures    lower    Past Surgical History:  Procedure Laterality Date  . ABDOMINAL HYSTERECTOMY    . BROW LIFT Bilateral 03/09/2018   Procedure: BLEPHAROPLASTY REPAIR RESECT;  Surgeon: Karle Starch, MD;  Location: [facility];  Service: Ophthalmology;  Laterality: Bilateral;  . CATARACT EXTRACTION W/PHACO Right 10/29/2016   Procedure: CATARACT EXTRACTION  PHACO AND INTRAOCULAR LENS PLACEMENT (IOC);  Surgeon: Estill Cotta, MD;  Location: [facility];  Service: Ophthalmology;  Laterality: Right;  [place]  01:04 AP% 24.1 CDE 28.71 fluid pack lot # [id number]  . COLONOSCOPY    . FOOT SURGERY     bunion  . PTOSIS REPAIR Bilateral 03/09/2018   Procedure: PTOSIS REPAIR;  Surgeon: Karle Starch, MD;  Location: [facility];  Service: Ophthalmology;  Laterality: Bilateral;    Social History   Socioeconomic History  . Marital status: Married    Spouse name: Not on file  . Number of children: 2  . Years of education: Not on file  . Highest education level: Not on file  Occupational History  . Not on file  Social Needs  . Financial resource strain: Not hard at all  . Food insecurity    Worry: Never true    Inability: Never true  . Transportation needs    Medical: No    Non-medical: No  Tobacco Use  . Smoking status: Former Smoker    Types: Cigarettes    Quit date: 09/19/1969    Years since quitting: 49.6  . Smokeless tobacco: Never Used  Substance and Sexual Activity  . Alcohol use: No  . Drug use: No  . Sexual activity: Not Currently  Lifestyle  . Physical activity    Days per week: Not on file  Minutes per session: Not on file  . Stress: To some extent  Relationships  . Social connections    Talks on phone: More than three times a week    Gets together: More than three times a week    Attends religious service: More than 4 times per year    Active member of club or organization: Yes    Attends meetings of clubs or organizations: More than 4 times per year    Relationship status: Married  . Intimate partner violence    Fear of current or ex partner: No    Emotionally abused: No    Physically abused: No    Forced sexual activity: No  Other Topics Concern  . Not on file  Social History Narrative  . Not on file    Family History  Problem Relation Age of Onset  . Pancreatic cancer Mother   . Heart attack Maternal  Aunt   . Lung cancer Cousin   . Lung cancer Cousin   . Breast cancer Neg Hx      Current Outpatient Medications:  .  amLODipine (NORVASC) 5 MG tablet, Take 5 mg by mouth daily. 11am, Disp: , Rfl:  .  atorvastatin (LIPITOR) 20 MG tablet, Take 20 mg by mouth daily. 11am, Disp: , Rfl:  .  B Complex Vitamins (B COMPLEX PO), Take 1 Dose by mouth daily., Disp: , Rfl:  .  cetirizine (ZYRTEC) 10 MG tablet, Take 10 mg by mouth daily., Disp: , Rfl:  .  citalopram (CELEXA) 20 MG tablet, Take 20 mg by mouth daily., Disp: , Rfl:  .  docusate sodium (COLACE) 100 MG capsule, Take 1 tablet once or twice daily as needed for constipation while taking narcotic pain medicine (Patient taking differently: Take 100 mg by mouth daily as needed (constipation). ), Disp: 30 capsule, Rfl: 0 .  ergocalciferol (VITAMIN D2) 1.25 MG (50000 UT) capsule, Take 50,000 Units by mouth once a week., Disp: , Rfl:  .  Ferrous Fumarate-Folic Acid (HEMATINIC/FOLIC ACID PO), Take 1 tablet by mouth daily., Disp: , Rfl:  .  fluticasone (FLONASE) 50 MCG/ACT nasal spray, Place 2 sprays into the nose daily as needed for allergies., Disp: , Rfl:  .  folic acid (FOLVITE) 1 MG tablet, Take 1 mg by mouth daily. , Disp: , Rfl:  .  gabapentin (NEURONTIN) 100 MG capsule, Take 100 mg by mouth 2 (two) times daily., Disp: , Rfl:  .  hydrochlorothiazide (MICROZIDE) 12.5 MG capsule, Take 12.5 mg by mouth daily., Disp: , Rfl:  .  ibandronate (BONIVA) 150 MG tablet, Take 150 mg by mouth every 30 (thirty) days. Take in the morning with a full glass of water, on an empty stomach, and do not take anything else by mouth or lie down for the next 30 min., Disp: , Rfl:  .  Lactobacillus (PROBIOTIC ACIDOPHILUS PO), Take 1 capsule by mouth daily., Disp: , Rfl:  .  LORazepam (ATIVAN) 1 MG tablet, Take 1 mg by mouth at bedtime as needed for sleep., Disp: , Rfl:  .  Omega-3 Fatty Acids (FISH OIL) 1000 MG CPDR, Take 1 capsule by mouth daily., Disp: , Rfl:  .   omeprazole (PRILOSEC) 40 MG capsule, Take 40 mg by mouth daily as needed (acid reflux)., Disp: , Rfl:  .  potassium chloride SA (K-DUR,KLOR-CON) 20 MEQ tablet, Take 20 mEq by mouth daily., Disp: , Rfl:  .  traMADol (ULTRAM) 50 MG tablet, Take 1 every 4-6 hours as needed  for pain not controlled by Tylenol, Disp: 6 tablet, Rfl: 0  Ct Bone Marrow Biopsy & Aspiration  Result Date: 05/06/2019 INDICATION: Pancytopenia of uncertain etiology. Please perform CT-guided bone marrow biopsy for tissue diagnostic purposes. EXAM: CT-GUIDED BONE MARROW BIOPSY AND ASPIRATION MEDICATIONS: None ANESTHESIA/SEDATION: Fentanyl 100 mcg IV; Versed 2 mg IV Sedation Time: 15 Minutes; The patient was continuously monitored during the procedure by the interventional radiology nurse under my direct supervision. COMPLICATIONS: None immediate. PROCEDURE: Informed consent was obtained from the patient following an explanation of the procedure, risks, benefits and alternatives. The patient understands, agrees and consents for the procedure. All questions were addressed. A time out was performed prior to the initiation of the procedure. The patient was positioned prone and non-contrast localization CT was performed of the pelvis to demonstrate the iliac marrow spaces. The operative site was prepped and draped in the usual sterile fashion. Under sterile conditions and local anesthesia, a 22 gauge spinal needle was utilized for procedural planning. Next, an 11 gauge coaxial bone biopsy needle was advanced into the left iliac marrow space. Needle position was confirmed with CT imaging. Initially, bone marrow aspiration was performed. Next, a bone marrow biopsy was obtained with the 11 gauge outer bone marrow device. The 11 gauge coaxial bone biopsy needle was re-advanced into a slightly different location within the left iliac marrow space, positioning was confirmed and an additional bone marrow biopsy was obtained. Samples were prepared with the  cytotechnologist and deemed adequate. The needle was removed intact. Hemostasis was obtained with compression and a dressing was placed. The patient tolerated the procedure well without immediate post procedural complication. IMPRESSION: Successful CT guided left iliac bone marrow aspiration and core biopsy. Electronically Signed   By: Sandi Mariscal M.D.   On: 05/06/2019 09:35    No images are attached to the encounter.   CMP Latest Ref Rng & Units 04/25/2019  Glucose 70 - 99 mg/dL 137(H)  BUN 8 - 23 mg/dL 20  Creatinine 0.44 - 1.00 mg/dL 0.77  Sodium 135 - 145 mmol/L 138  Potassium 3.5 - 5.1 mmol/L 3.6  Chloride 98 - 111 mmol/L 101  CO2 22 - 32 mmol/L 18(L)  Calcium 8.9 - 10.3 mg/dL 9.6  Total Protein 6.5 - 8.1 g/dL 7.7  Total Bilirubin 0.3 - 1.2 mg/dL 0.6  Alkaline Phos 38 - 126 U/L 54  AST 15 - 41 U/L 25  ALT 0 - 44 U/L 13   CBC Latest Ref Rng & Units 05/06/2019  WBC 4.0 - 10.5 K/uL 1.9(L)  Hemoglobin 12.0 - 15.0 g/dL 11.2(L)  Hematocrit 36.0 - 46.0 % 34.5(L)  Platelets 150 - 400 K/uL 90(L)     Observation/objective: Appears in no acute distress of a video visit today.  Breathing is nonlabored  Assessment and plan: Patient is a 73 year old female referred for pancytopenia.  Bone marrow biopsy findings suggestive of high-grade B-cell neoplasm: B-cell lymphoblastic leukemia/lymphoma versus high-grade B-cell lymphoma  I discussed the results of the bone marrow biopsy with the patient in detail.  Patient has a hypercellular bone marrow and significant pancytopenia.  Bone marrow biopsy findings were somewhat conflicting and flow cytometry and IHC findings are suggestive the patient may either have a B-cell acute lymphoblastic leukemia versus a high-grade B-cell lymphoma.  Further FISH studies for characterization are pending.  Discussed that if this is a B-cell ALL-patient would be better served at a tertiary center and I would like to refer her to Dr. Clint Bolder for further management  and  second opinion.  I will also obtain a PET CT scan at this time given the high-grade B-cell lymphoma remains a possibility.  I will check a CBC with differential, LDH, intellegin myeloid panel  and uric acid on the day she comes for PET scan.  I will schedule her follow-up with me based on opinion from [facility].  I did explain to her that regardless of whether this is a B-cell leukemia versus a high-grade B-cell lymphoma-combination chemotherapy would be recommended.  Although patient is 73 years of age she is relatively in good health with few comorbidities and a good performance status.  There may also be a role for bone marrow transplant down the line.  Patient and her family verbalized understanding.  Based on further tests if this turns out to be a high-grade B-cell lymphoma, then she can potentially get treatments like [facility] or [place] here.  These decisions will be deferred after she has an appointment at [facility]   I discussed the assessment and treatment plan with the patient. The patient was provided an opportunity to ask questions and all were answered. The patient agreed with the plan and demonstrated an understanding of the instructions.   The patient was advised to call back or seek an in-person evaluation if the symptoms worsen or if the condition fails to improve as anticipated.   Visit Diagnosis: 1. 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 Randa Evens, MD, MPH [facility] at [facility] [phone] 05/13/2019 8:52 AM

## 2019-05-16 ENCOUNTER — Telehealth (INDEPENDENT_AMBULATORY_CARE_PROVIDER_SITE_OTHER): Payer: Self-pay

## 2019-05-16 ENCOUNTER — Encounter (HOSPITAL_COMMUNITY): Payer: Self-pay | Admitting: Oncology

## 2019-05-16 ENCOUNTER — Inpatient Hospital Stay: Payer: Medicare HMO | Admitting: Oncology

## 2019-05-17 ENCOUNTER — Telehealth: Payer: Self-pay

## 2019-05-17 MED ORDER — ALLOPURINOL 300 MG PO TABS
300.00 | ORAL_TABLET | ORAL | Status: DC
Start: 2019-05-18 — End: 2019-05-17

## 2019-05-17 MED ORDER — PNEUMOCOCCAL VAC POLYVALENT 25 MCG/0.5ML IJ INJ
0.50 | INJECTION | INTRAMUSCULAR | Status: DC
Start: ? — End: 2019-05-17

## 2019-05-17 MED ORDER — MELATONIN 3 MG PO TABS
3.00 | ORAL_TABLET | ORAL | Status: DC
Start: ? — End: 2019-05-17

## 2019-05-17 MED ORDER — PANTOPRAZOLE SODIUM 40 MG PO TBEC
40.00 | DELAYED_RELEASE_TABLET | ORAL | Status: DC
Start: 2019-05-26 — End: 2019-05-17

## 2019-05-17 MED ORDER — CETIRIZINE HCL 10 MG PO TABS
10.00 | ORAL_TABLET | ORAL | Status: DC
Start: ? — End: 2019-05-17

## 2019-05-17 MED ORDER — CITALOPRAM HYDROBROMIDE 20 MG PO TABS
20.00 | ORAL_TABLET | ORAL | Status: DC
Start: 2019-05-26 — End: 2019-05-17

## 2019-05-17 MED ORDER — ATORVASTATIN CALCIUM 20 MG PO TABS
20.00 | ORAL_TABLET | ORAL | Status: DC
Start: 2019-05-18 — End: 2019-05-17

## 2019-05-17 MED ORDER — SODIUM CHLORIDE 0.9 % IV SOLN
75.00 | INTRAVENOUS | Status: DC
Start: ? — End: 2019-05-17

## 2019-05-17 NOTE — Telephone Encounter (Signed)
Per Dr. Elroy Channel request, [employer] faxed to Raylene Everts at [phone]. Confirmed fax received.

## 2019-05-19 MED ORDER — ALLOPURINOL 300 MG PO TABS
300.00 | ORAL_TABLET | ORAL | Status: DC
Start: 2019-05-21 — End: 2019-05-19

## 2019-05-19 MED ORDER — ACETAMINOPHEN 325 MG PO TABS
650.00 | ORAL_TABLET | ORAL | Status: DC
Start: ? — End: 2019-05-19

## 2019-05-20 ENCOUNTER — Encounter (HOSPITAL_COMMUNITY): Payer: Self-pay | Admitting: Oncology

## 2019-05-20 MED ORDER — POLYETHYLENE GLYCOL 3350 17 G PO PACK
17.00 | PACK | ORAL | Status: DC
Start: 2019-05-21 — End: 2019-05-20

## 2019-05-20 MED ORDER — MAGNESIUM OXIDE 400 MG PO TABS
400.00 | ORAL_TABLET | ORAL | Status: DC
Start: 2019-05-20 — End: 2019-05-20

## 2019-05-20 MED ORDER — LIDOCAINE 5 % EX PTCH
1.00 | MEDICATED_PATCH | CUTANEOUS | Status: DC
Start: 2019-05-26 — End: 2019-05-20

## 2019-05-21 MED ORDER — GENERIC EXTERNAL MEDICATION
1.00 | Status: DC
Start: 2019-05-21 — End: 2019-05-21

## 2019-05-21 MED ORDER — FENTANYL CITRATE (PF) 50 MCG/ML IJ SOLN
INTRAMUSCULAR | Status: DC
Start: ? — End: 2019-05-21

## 2019-05-21 MED ORDER — PREDNISONE 50 MG PO TABS
100.00 | ORAL_TABLET | ORAL | Status: DC
Start: 2019-05-22 — End: 2019-05-21

## 2019-05-21 MED ORDER — SODIUM CHLORIDE 0.9 % IV SOLN
INTRAVENOUS | Status: DC
Start: ? — End: 2019-05-21

## 2019-05-21 MED ORDER — GENERIC EXTERNAL MEDICATION
Status: DC
Start: ? — End: 2019-05-21

## 2019-05-21 MED ORDER — POLYETHYLENE GLYCOL 3350 17 G PO PACK
17.00 | PACK | ORAL | Status: DC
Start: 2019-05-25 — End: 2019-05-21

## 2019-05-25 MED ORDER — SODIUM CHLORIDE 0.9 % IV SOLN
20.00 | INTRAVENOUS | Status: DC
Start: ? — End: 2019-05-25

## 2019-05-25 MED ORDER — DIPHENHYDRAMINE HCL 50 MG/ML IJ SOLN
25.00 | INTRAMUSCULAR | Status: DC
Start: ? — End: 2019-05-25

## 2019-05-25 MED ORDER — GENERIC EXTERNAL MEDICATION
2.00 | Status: DC
Start: 2019-05-25 — End: 2019-05-25

## 2019-05-25 MED ORDER — FAMOTIDINE 20 MG/2ML IV SOLN
20.00 | INTRAVENOUS | Status: DC
Start: ? — End: 2019-05-25

## 2019-05-25 MED ORDER — AMLODIPINE BESYLATE 5 MG PO TABS
5.00 | ORAL_TABLET | ORAL | Status: DC
Start: 2019-05-26 — End: 2019-05-25

## 2019-05-25 MED ORDER — GENERIC EXTERNAL MEDICATION
Status: DC
Start: ? — End: 2019-05-25

## 2019-05-25 MED ORDER — GENERIC EXTERNAL MEDICATION
10.00 | Status: DC
Start: 2019-05-25 — End: 2019-05-25

## 2019-05-25 MED ORDER — METHYLPREDNISOLONE SODIUM SUCC 125 MG IJ SOLR
125.00 | INTRAMUSCULAR | Status: DC
Start: ? — End: 2019-05-25

## 2019-05-25 MED ORDER — GENERIC EXTERNAL MEDICATION
60.00 | Status: DC
Start: 2019-05-26 — End: 2019-05-25

## 2019-05-25 MED ORDER — FLUTICASONE PROPIONATE 50 MCG/ACT NA SUSP
1.00 | NASAL | Status: DC
Start: ? — End: 2019-05-25

## 2019-05-25 MED ORDER — GENERIC EXTERNAL MEDICATION
10.00 | Status: DC
Start: ? — End: 2019-05-25

## 2019-05-25 MED ORDER — MEPERIDINE HCL 25 MG/ML IJ SOLN
25.00 | INTRAMUSCULAR | Status: DC
Start: ? — End: 2019-05-25

## 2019-05-25 MED ORDER — SODIUM CHLORIDE 0.9 % IV SOLN
1000.00 | INTRAVENOUS | Status: DC
Start: ? — End: 2019-05-25

## 2019-05-25 MED ORDER — SODIUM CHLORIDE 0.9 % IV SOLN
50.00 | INTRAVENOUS | Status: DC
Start: ? — End: 2019-05-25

## 2019-05-25 MED ORDER — ALLOPURINOL 300 MG PO TABS
300.00 | ORAL_TABLET | ORAL | Status: DC
Start: 2019-05-26 — End: 2019-05-25

## 2019-05-25 MED ORDER — GENERIC EXTERNAL MEDICATION
750.00 | Status: DC
Start: 2019-05-25 — End: 2019-05-25

## 2019-05-25 MED ORDER — PROCHLORPERAZINE MALEATE 10 MG PO TABS
10.00 | ORAL_TABLET | ORAL | Status: DC
Start: ? — End: 2019-05-25

## 2019-05-25 MED ORDER — EPINEPHRINE 0.3 MG/0.3ML IJ SOAJ
.30 | INTRAMUSCULAR | Status: DC
Start: ? — End: 2019-05-25

## 2019-05-26 ENCOUNTER — Telehealth: Payer: Self-pay | Admitting: *Deleted

## 2019-05-26 NOTE — Telephone Encounter (Signed)
Rcvd call from pt's daughter about need to see if she should send fmla papers there so she can help transport her Mom to md visits and infusions. When  I called back I said that I have a message from you about FMLA papers.  She told me that her Mom is going to [facility] for treatments. She has been doing well, she just got out of the hospital and her mom will need to help to get to her appts.  She said that she knew that her mom was there at [facility] first and she will be getting all her care at [facility] for now. I told her that if she knows that her mom is having treatment at the [facility] oncology then she would need to send her forms to where the patient is receiving care. She will speak to her mother and call [facility]. She was thankful for my return call

## 2019-05-29 ENCOUNTER — Other Ambulatory Visit: Payer: Self-pay

## 2019-05-29 ENCOUNTER — Emergency Department: Payer: Medicare HMO

## 2019-05-29 ENCOUNTER — Inpatient Hospital Stay
Admission: EM | Admit: 2019-05-29 | Discharge: 2019-06-03 | DRG: 872 | Disposition: A | Discharge status: Home / Self Care | Payer: Medicare HMO | Attending: Internal Medicine | Admitting: Internal Medicine

## 2019-05-29 DIAGNOSIS — R5081 Fever presenting with conditions classified elsewhere: Secondary | ICD-10-CM | POA: Diagnosis present

## 2019-05-29 DIAGNOSIS — Z800 Family history of malignant neoplasm of digestive organs: Secondary | ICD-10-CM

## 2019-05-29 DIAGNOSIS — D703 Neutropenia due to infection: Secondary | ICD-10-CM | POA: Diagnosis present

## 2019-05-29 DIAGNOSIS — Z7951 Long term (current) use of inhaled steroids: Secondary | ICD-10-CM

## 2019-05-29 DIAGNOSIS — M810 Age-related osteoporosis without current pathological fracture: Secondary | ICD-10-CM | POA: Diagnosis present

## 2019-05-29 DIAGNOSIS — Z79899 Other long term (current) drug therapy: Secondary | ICD-10-CM

## 2019-05-29 DIAGNOSIS — C8330 Diffuse large B-cell lymphoma, unspecified site: Secondary | ICD-10-CM | POA: Diagnosis present

## 2019-05-29 DIAGNOSIS — Z8249 Family history of ischemic heart disease and other diseases of the circulatory system: Secondary | ICD-10-CM

## 2019-05-29 DIAGNOSIS — I10 Essential (primary) hypertension: Secondary | ICD-10-CM | POA: Diagnosis present

## 2019-05-29 DIAGNOSIS — Z888 Allergy status to other drugs, medicaments and biological substances status: Secondary | ICD-10-CM

## 2019-05-29 DIAGNOSIS — Z801 Family history of malignant neoplasm of trachea, bronchus and lung: Secondary | ICD-10-CM

## 2019-05-29 DIAGNOSIS — Z87891 Personal history of nicotine dependence: Secondary | ICD-10-CM

## 2019-05-29 DIAGNOSIS — Z20828 Contact with and (suspected) exposure to other viral communicable diseases: Secondary | ICD-10-CM | POA: Diagnosis present

## 2019-05-29 DIAGNOSIS — T451X5A Adverse effect of antineoplastic and immunosuppressive drugs, initial encounter: Secondary | ICD-10-CM | POA: Diagnosis present

## 2019-05-29 DIAGNOSIS — A419 Sepsis, unspecified organism: Secondary | ICD-10-CM | POA: Diagnosis not present

## 2019-05-29 DIAGNOSIS — F419 Anxiety disorder, unspecified: Secondary | ICD-10-CM | POA: Diagnosis present

## 2019-05-29 DIAGNOSIS — K219 Gastro-esophageal reflux disease without esophagitis: Secondary | ICD-10-CM | POA: Diagnosis present

## 2019-05-29 DIAGNOSIS — E785 Hyperlipidemia, unspecified: Secondary | ICD-10-CM | POA: Diagnosis present

## 2019-05-29 DIAGNOSIS — D709 Neutropenia, unspecified: Secondary | ICD-10-CM | POA: Diagnosis present

## 2019-05-29 DIAGNOSIS — D6959 Other secondary thrombocytopenia: Secondary | ICD-10-CM | POA: Diagnosis present

## 2019-05-29 DIAGNOSIS — Z7983 Long term (current) use of bisphosphonates: Secondary | ICD-10-CM

## 2019-05-29 DIAGNOSIS — C8590 Non-Hodgkin lymphoma, unspecified, unspecified site: Secondary | ICD-10-CM | POA: Diagnosis present

## 2019-05-29 DIAGNOSIS — R509 Fever, unspecified: Secondary | ICD-10-CM | POA: Diagnosis not present

## 2019-05-29 DIAGNOSIS — K5900 Constipation, unspecified: Secondary | ICD-10-CM | POA: Diagnosis present

## 2019-05-29 DIAGNOSIS — D6481 Anemia due to antineoplastic chemotherapy: Secondary | ICD-10-CM | POA: Diagnosis present

## 2019-05-29 DIAGNOSIS — D61810 Antineoplastic chemotherapy induced pancytopenia: Secondary | ICD-10-CM | POA: Diagnosis present

## 2019-05-29 LAB — CBC WITH DIFFERENTIAL/PLATELET
Abs Immature Granulocytes: 0 10*3/uL (ref 0.00–0.07)
Basophils Absolute: 0 10*3/uL (ref 0.0–0.1)
Basophils Relative: 0 %
Eosinophils Absolute: 0 10*3/uL (ref 0.0–0.5)
Eosinophils Relative: 5 %
HCT: 26.9 % — ABNORMAL LOW (ref 36.0–46.0)
Hemoglobin: 8.7 g/dL — ABNORMAL LOW (ref 12.0–15.0)
Immature Granulocytes: 0 %
Lymphocytes Relative: 95 %
Lymphs Abs: 0.2 10*3/uL — ABNORMAL LOW (ref 0.7–4.0)
MCH: 28.2 pg (ref 26.0–34.0)
MCHC: 32.3 g/dL (ref 30.0–36.0)
MCV: 87.3 fL (ref 80.0–100.0)
Monocytes Absolute: 0 10*3/uL — ABNORMAL LOW (ref 0.1–1.0)
Monocytes Relative: 0 %
Neutro Abs: 0 10*3/uL — ABNORMAL LOW (ref 1.7–7.7)
Neutrophils Relative %: 0 %
Platelets: 19 10*3/uL — CL (ref 150–400)
RBC: 3.08 MIL/uL — ABNORMAL LOW (ref 3.87–5.11)
RDW: 16.4 % — ABNORMAL HIGH (ref 11.5–15.5)
Smear Review: NORMAL
WBC: 0.2 10*3/uL — CL (ref 4.0–10.5)
nRBC: 0 % (ref 0.0–0.2)

## 2019-05-29 LAB — PROTIME-INR
INR: 1 (ref 0.8–1.2)
Prothrombin Time: 13.4 seconds (ref 11.4–15.2)

## 2019-05-29 LAB — COMPREHENSIVE METABOLIC PANEL
ALT: 18 U/L (ref 0–44)
AST: 20 U/L (ref 15–41)
Albumin: 3.7 g/dL (ref 3.5–5.0)
Alkaline Phosphatase: 54 U/L (ref 38–126)
Anion gap: 13 (ref 5–15)
BUN: 16 mg/dL (ref 8–23)
CO2: 28 mmol/L (ref 22–32)
Calcium: 9.1 mg/dL (ref 8.9–10.3)
Chloride: 96 mmol/L — ABNORMAL LOW (ref 98–111)
Creatinine, Ser: 0.76 mg/dL (ref 0.44–1.00)
GFR calc Af Amer: >60 mL/min (ref >60)
GFR calc non Af Amer: >60 mL/min (ref >60)
Glucose, Bld: 160 mg/dL — ABNORMAL HIGH (ref 70–99)
Potassium: 3.1 mmol/L — ABNORMAL LOW (ref 3.5–5.1)
Sodium: 137 mmol/L (ref 135–145)
Total Bilirubin: 1.1 mg/dL (ref 0.3–1.2)
Total Protein: 6.5 g/dL (ref 6.5–8.1)

## 2019-05-29 LAB — LACTIC ACID, PLASMA: Lactic Acid, Venous: 3.9 mmol/L (ref 0.5–1.9)

## 2019-05-29 MED ORDER — VANCOMYCIN HCL 10 G IV SOLR
1750.0000 mg | Freq: Once | INTRAVENOUS | Status: AC
  Administered 2019-05-29: 1750 mg via INTRAVENOUS
  Filled 2019-05-29: qty 1750

## 2019-05-29 MED ORDER — SODIUM CHLORIDE 0.9 % IV SOLN
2.0000 g | Freq: Once | INTRAVENOUS | Status: AC
  Administered 2019-05-29: 2 g via INTRAVENOUS
  Filled 2019-05-29: qty 2

## 2019-05-29 MED ORDER — VANCOMYCIN HCL IN DEXTROSE 1-5 GM/200ML-% IV SOLN: 1000.0000 mg | Freq: Once | INTRAVENOUS | Status: DC

## 2019-05-29 MED ORDER — METRONIDAZOLE IN NACL 5-0.79 MG/ML-% IV SOLN
500.0000 mg | Freq: Once | INTRAVENOUS | Status: DC
  Filled 2019-05-29: qty 100

## 2019-05-29 MED ORDER — ACETAMINOPHEN 325 MG PO TABS
650.0000 mg | ORAL_TABLET | Freq: Once | ORAL | Status: AC
  Administered 2019-05-29: 650 mg via ORAL
  Filled 2019-05-29: qty 2

## 2019-05-29 NOTE — Consult Note (Signed)
PHARMACY -  BRIEF ANTIBIOTIC NOTE   Pharmacy has received consult(s) for Cefepime and vancomycin  from an ED provider.  The patient's profile has been reviewed for ht/wt/allergies/indication/available labs.    One time order(s) placed for Vancomycin 1750 mg and Cefepime 2g IV x 1 dose ordered.   Spoke with Dawn, RN @ [phone] about change in vancomycin dose.   Further antibiotics/pharmacy consults should be ordered by admitting physician if indicated.                       Thank you, Pernell Dupre, PharmD, BCPS Clinical Pharmacist 05/29/2019 11:17 PM

## 2019-05-29 NOTE — ED Triage Notes (Signed)
Pt here with neutropenia and fever. Pt states her oncologist told her to come to ED. Pt states last tylenol at 1900. Pt state she feels weak and tired.

## 2019-05-29 NOTE — ED Notes (Signed)
Patient reports recent inpatient and started chemo.  Patient reports fever today.  Last took tylenol at 7 pm and noticed a headache at that time.  Patient denies cough, vomiting or diarrhea.

## 2019-05-29 NOTE — ED Provider Notes (Signed)
[facility] Emergency Department Provider Note   First MD Initiated Contact with Patient 05/29/19 2312     (approximate)  I have reviewed the triage vital signs and the nursing notes.   HISTORY  Chief Complaint Fever    HPI KIKUE GERHART is a 73 y.o. 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  Patient reports a temperature of 100.3 earlier today and then subsequently 100.9.  Patient's last Tylenol was at 7:00 PM tonight.  Patient denies any symptoms.  Patient denies any cough no nausea or vomiting.  Patient denies any diarrhea.  Patient denies any nasal congestion.  Patient denies any urinary symptoms.        Past Medical History:  Diagnosis Date  . Anemia   . Anxiety   . GERD (gastroesophageal reflux disease)   . Heart murmur   . Hypercholesteremia   . Hypertension    controlled on meds  . Osteoporosis   . Pancytopenia ([place])   . Wears dentures    lower    Patient Active Problem List   Diagnosis Date Noted  . Hypertension 04/25/2019  . 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 ABDOMINAL HYSTERECTOMY    . BROW LIFT Bilateral 03/09/2018   Procedure: BLEPHAROPLASTY REPAIR RESECT;  Surgeon: Karle Starch, MD;  Location: [place];  Service: Ophthalmology;  Laterality: Bilateral;  . CATARACT EXTRACTION W/PHACO Right 10/29/2016   Procedure: CATARACT EXTRACTION PHACO AND INTRAOCULAR LENS PLACEMENT (IOC);  Surgeon: Estill Cotta, MD;  Location: [facility];  Service: Ophthalmology;  Laterality: Right;  [place]  01:04 AP% 24.1 CDE 28.71 fluid pack lot # [id number]  . COLONOSCOPY    . FOOT SURGERY     bunion  . PTOSIS REPAIR Bilateral 03/09/2018   Procedure: PTOSIS REPAIR;  Surgeon: Karle Starch, MD;  Location: [place];  Service: Ophthalmology;  Laterality: Bilateral;    Prior to Admission medications   Medication Sig Start Date End Date Taking? Authorizing Provider  acetaminophen (TYLENOL) 500 MG tablet Take 1,000 mg by mouth every 6 (six) hours.   Yes [provider]  allopurinol (ZYLOPRIM) 300 MG tablet Take 300 mg by mouth daily. 05/25/19  Yes [provider]  amLODipine (NORVASC) 5 MG tablet Take 5 mg by mouth daily. 11am   Yes [provider]  atorvastatin (LIPITOR) 20 MG tablet Take 20 mg by mouth daily. 11am   Yes [provider]  cetirizine (ZYRTEC) 10 MG tablet Take 10 mg by mouth at bedtime.    Yes [provider]  citalopram (CELEXA) 20 MG tablet Take 20 mg by mouth daily.   Yes [provider]  docusate sodium (COLACE) 100 MG capsule Take 1 tablet once or twice daily as needed for constipation while taking narcotic pain medicine Patient taking differently: Take 100 mg by mouth daily as needed (constipation).  06/22/16  Yes Hinda Kehr, MD  ergocalciferol (VITAMIN D2) 1.25 MG (50000 UT) capsule Take 50,000 Units by mouth every Friday.    Yes [provider]  fluconazole (DIFLUCAN) 200 MG tablet Take 400 mg by mouth daily. 05/25/19  Yes [provider]  fluticasone (FLONASE) 50 MCG/ACT nasal spray Place 2 sprays into the nose daily as needed for allergies.  Yes [provider]  folic acid (FOLVITE) 1 MG tablet Take 1 mg by mouth daily.  04/19/19  Yes [provider]  gabapentin (NEURONTIN) 100 MG capsule Take 100 mg by mouth 2 (two) times daily. 01/27/19  Yes [provider]  hydrochlorothiazide (MICROZIDE) 12.5 MG capsule Take 12.5 mg by mouth daily.   Yes [provider]  ibandronate (BONIVA) 150 MG tablet Take 150 mg by mouth every 30 (thirty) days. Take in the morning with a full glass of water, on an empty  stomach, and do not take anything else by mouth or lie down for the next 30 min.   Yes [provider]  Lactobacillus (PROBIOTIC ACIDOPHILUS PO) Take 1 capsule by mouth daily.   Yes [provider]  levofloxacin (LEVAQUIN) 500 MG tablet Take 500 mg by mouth daily. 05/21/19  Yes [provider]  LORazepam (ATIVAN) 1 MG tablet Take 1 mg by mouth at bedtime as needed for sleep.   Yes [provider]  Omega-3 Fatty Acids (FISH OIL) 1000 MG CPDR Take 1 capsule by mouth daily.   Yes [provider]  omeprazole (PRILOSEC) 40 MG capsule Take 40 mg by mouth daily.    Yes [provider]  potassium chloride SA (K-DUR,KLOR-CON) 20 MEQ tablet Take 20 mEq by mouth daily.   Yes [provider]  valACYclovir (VALTREX) 500 MG tablet Take 500 mg by mouth daily. 05/25/19  Yes [provider]    Allergies Ace inhibitors  Family History  Problem Relation Age of Onset  . Pancreatic cancer Mother   . Heart attack Maternal Aunt   . Lung cancer Cousin   . Lung cancer Cousin   . Breast cancer Neg Hx     Social History Social History   Tobacco Use  . Smoking status: Former Smoker    Types: Cigarettes    Quit date: 09/19/1969    Years since quitting: 49.7  . Smokeless tobacco: Never Used  Substance Use Topics  . Alcohol use: No  . Drug use: No    Review of Systems Constitutional: Positive for fever Eyes: No visual changes. ENT: No sore throat. Cardiovascular: Denies chest pain. Respiratory: Denies shortness of breath. Gastrointestinal: No abdominal pain.  No nausea, no vomiting.  No diarrhea.  No constipation. Genitourinary: Negative for dysuria. Musculoskeletal: Negative for neck pain.  Negative for back pain. Integumentary: Negative for rash. Neurological: Negative for headaches, focal weakness or numbness.   ____________________________________________   PHYSICAL EXAM:  VITAL SIGNS: ED Triage Vitals [05/29/19 2157]   Enc Vitals Group     BP 121/63     Pulse Rate 82     Resp 20     Temp (!) 101 F (38.3 C)     Temp Source Oral     SpO2 100 %     Weight 83.5 kg (184 lb)     Height 1.626 m (5\' 4" )     Head Circumference      Peak Flow      Pain Score 5     Pain Loc      Pain Edu?      Excl. in [place]?     Constitutional: Alert and oriented.  Eyes: Conjunctivae are normal.  Head: Atraumatic. Nose: No congestion/rhinnorhea. Mouth/Throat: Mucous membranes are moist. Neck: No stridor.  No meningeal signs.   Cardiovascular: Normal rate, regular rhythm. Good peripheral circulation. Grossly normal heart sounds. Respiratory: Normal respiratory effort.  No retractions. Gastrointestinal: Soft and nontender.  No distention.  Musculoskeletal: No lower extremity tenderness nor edema. No gross deformities of extremities. Neurologic:  Normal speech and language. No gross focal neurologic deficits are appreciated.  Skin:  Skin is warm, dry and intact.  Ecchymosis noted that right chest wall port site.  Mild tenderness to palpation no blanching erythema or any signs of infection. Psychiatric: Mood and affect are normal. Speech and behavior are normal.  ____________________________________________   LABS (all labs ordered are listed, but only abnormal results are displayed)  Labs Reviewed  COMPREHENSIVE METABOLIC PANEL - Abnormal; Notable for the following components:      Result Value   Potassium 3.1 (*)    Chloride 96 (*)    Glucose, Bld 160 (*)    All other components within normal limits  LACTIC ACID, PLASMA - Abnormal; Notable for the following components:   Lactic Acid, Venous 3.9 (*)    All other components within normal limits  CBC WITH DIFFERENTIAL/PLATELET - Abnormal; Notable for the following components:   WBC 0.2 (*)    RBC 3.08 (*)    Hemoglobin 8.7 (*)    HCT 26.9 (*)    RDW 16.4 (*)    Platelets 19 (*)    Neutro Abs 0.0 (*)    Lymphs Abs 0.2 (*)    Monocytes Absolute 0.0 (*)    All  other components within normal limits  SARS CORONAVIRUS 2 (HOSPITAL ORDER, [place] [facility])  CULTURE, BLOOD (ROUTINE X 2)  CULTURE, BLOOD (ROUTINE X 2)  URINE CULTURE  PROTIME-INR  LACTIC ACID, PLASMA  URINALYSIS, COMPLETE (UACMP) WITH MICROSCOPIC  URINALYSIS, ROUTINE W REFLEX MICROSCOPIC  PATHOLOGIST SMEAR REVIEW   ____________  RADIOLOGY I, [place] Ernst Bowler, personally viewed and evaluated these images (plain radiographs) as part of my medical decision making, as well as reviewing the written report by the radiologist.  ED MD interpretation: Chest x-ray revealed no active disease per radiologist  Official radiology report(s): Dg Chest Portable 1 View  Result Date: 05/29/2019 CLINICAL DATA:  Neutropenic fever EXAM: PORTABLE CHEST 1 VIEW COMPARISON:  December 28, 2017 FINDINGS: There is a dual chamber right-sided Port-A-Cath that is well positioned. The heart size is stable. There is no focal infiltrate. No large pleural effusion. No acute osseous abnormality. IMPRESSION: No active disease. Electronically Signed   By: Constance Holster M.D.   On: 05/29/2019 23:56    ____________________________________________   PROCEDURES     .Critical Care Performed by: Gregor Hams, MD Authorized by: Gregor Hams, MD   Critical care provider statement:    Critical care time (minutes):  30   Critical care time was exclusive of:  Separately billable procedures and treating other patients (Neutropenic fever)   Critical care was necessary to treat or prevent imminent or life-threatening deterioration of the following conditions:  Sepsis   Critical care was time spent personally by me on the following activities:  Development of treatment plan with patient or surrogate, discussions with consultants, evaluation of patient's response to treatment, examination of patient, obtaining history from patient or surrogate, ordering and performing treatments and interventions,  ordering and review of laboratory studies, ordering and review of radiographic studies, pulse oximetry, re-evaluation of patient's condition and review of old charts     ____________________________________________   INITIAL IMPRESSION / MDM / [place] / ED COURSE  As part of my medical decision making, I reviewed the following data within the electronic MEDICAL RECORD NUMBER  73 year old female presenting with  above-stated history and physical exam secondary to neutropenic fever.  Sepsis protocol initiated broad-spectrum  IV antibiotics administered.  No clear foci of the patient's infection identified thus far chest x-ray revealed no evidence of pneumonia patient COVID negative.  Patient discussed with Dr. Jannifer Franklin for hospital admission for further evaluation and management.       ____________________________________________  FINAL CLINICAL IMPRESSION(S) / ED DIAGNOSES  Final diagnoses:  Neutropenic fever ([place])  Sepsis, due to unspecified organism, unspecified whether acute organ dysfunction present [facility])     MEDICATIONS GIVEN DURING THIS VISIT:  Medications  metroNIDAZOLE (FLAGYL) IVPB 500 mg (has no administration in time range)  vancomycin (VANCOCIN) 1,750 mg in sodium chloride 0.9 % 500 mL IVPB (1,750 mg Intravenous New Bag/Given 05/29/19 2342)  ceFEPIme (MAXIPIME) 2 g in sodium chloride 0.9 % 100 mL IVPB (0 g Intravenous Stopped 05/29/19 2340)  acetaminophen (TYLENOL) tablet 650 mg (650 mg Oral Given 05/29/19 2338)     ED Discharge Orders    None      *Please note:  WESLIE RASMUS was evaluated in Emergency Department on 05/30/2019 for the symptoms described in the history of present illness. She was evaluated in the context of the global COVID-19 pandemic, which necessitated consideration that the patient might be at risk for infection with the SARS-CoV-2 virus that causes COVID-19. Institutional protocols and algorithms that pertain to the evaluation of patients at  risk for COVID-19 are in a state of rapid change based on information released by regulatory bodies including the CDC and federal and state organizations. These policies and algorithms were followed during the patient's care in the ED.  Some ED evaluations and interventions may be delayed as a result of limited staffing during the pandemic.*  Note:  This document was prepared using Dragon voice recognition software and may include unintentional dictation errors.   Gregor Hams, MD 05/30/19 [phone]

## 2019-05-29 NOTE — Progress Notes (Addendum)
CODE SEPSIS - PHARMACY COMMUNICATION  **Broad Spectrum Antibiotics should be administered within 1 hour of Sepsis diagnosis**  Time Code Sepsis Called/Page Received: @ 2311  Antibiotics Ordered: Cefepime and Vancomycin   Time of 1st antibiotic administration: @ 2304  Additional action taken by pharmacy: Spoke with RN @ 2310 about vancomycin dose change   If necessary, Name of Provider/Nurse Contacted: Dawn, [place], PharmD, BCPS Clinical Pharmacist 05/29/2019 11:20 PM

## 2019-05-29 NOTE — ED Notes (Signed)
First Nurse Note:  Dr. Bjorn Loser from [facility] called PTA of patient. Patient is a newly dx lymphoma patient that had her first round of chemo (dose adjusted epoch) last week. Patient had blood work done at [facility] on 8/5 and was neutropenic with a count of 0.8. Patient had a temperature of 101.9 at home.

## 2019-05-30 ENCOUNTER — Encounter: Payer: Self-pay | Admitting: Internal Medicine

## 2019-05-30 ENCOUNTER — Other Ambulatory Visit: Payer: Self-pay

## 2019-05-30 DIAGNOSIS — C8590 Non-Hodgkin lymphoma, unspecified, unspecified site: Secondary | ICD-10-CM

## 2019-05-30 DIAGNOSIS — D6959 Other secondary thrombocytopenia: Secondary | ICD-10-CM | POA: Diagnosis present

## 2019-05-30 DIAGNOSIS — Z79899 Other long term (current) drug therapy: Secondary | ICD-10-CM | POA: Diagnosis not present

## 2019-05-30 DIAGNOSIS — M810 Age-related osteoporosis without current pathological fracture: Secondary | ICD-10-CM | POA: Diagnosis present

## 2019-05-30 DIAGNOSIS — F419 Anxiety disorder, unspecified: Secondary | ICD-10-CM | POA: Diagnosis present

## 2019-05-30 DIAGNOSIS — Z888 Allergy status to other drugs, medicaments and biological substances status: Secondary | ICD-10-CM | POA: Diagnosis not present

## 2019-05-30 DIAGNOSIS — K219 Gastro-esophageal reflux disease without esophagitis: Secondary | ICD-10-CM | POA: Diagnosis present

## 2019-05-30 DIAGNOSIS — Z87891 Personal history of nicotine dependence: Secondary | ICD-10-CM | POA: Diagnosis not present

## 2019-05-30 DIAGNOSIS — R5081 Fever presenting with conditions classified elsewhere: Secondary | ICD-10-CM | POA: Diagnosis present

## 2019-05-30 DIAGNOSIS — T451X5A Adverse effect of antineoplastic and immunosuppressive drugs, initial encounter: Secondary | ICD-10-CM | POA: Diagnosis present

## 2019-05-30 DIAGNOSIS — I10 Essential (primary) hypertension: Secondary | ICD-10-CM | POA: Diagnosis present

## 2019-05-30 DIAGNOSIS — Z8249 Family history of ischemic heart disease and other diseases of the circulatory system: Secondary | ICD-10-CM | POA: Diagnosis not present

## 2019-05-30 DIAGNOSIS — D61810 Antineoplastic chemotherapy induced pancytopenia: Secondary | ICD-10-CM | POA: Diagnosis not present

## 2019-05-30 DIAGNOSIS — E785 Hyperlipidemia, unspecified: Secondary | ICD-10-CM | POA: Diagnosis present

## 2019-05-30 DIAGNOSIS — Z800 Family history of malignant neoplasm of digestive organs: Secondary | ICD-10-CM | POA: Diagnosis not present

## 2019-05-30 DIAGNOSIS — K5900 Constipation, unspecified: Secondary | ICD-10-CM | POA: Diagnosis present

## 2019-05-30 DIAGNOSIS — Z7951 Long term (current) use of inhaled steroids: Secondary | ICD-10-CM | POA: Diagnosis not present

## 2019-05-30 DIAGNOSIS — D6481 Anemia due to antineoplastic chemotherapy: Secondary | ICD-10-CM | POA: Diagnosis present

## 2019-05-30 DIAGNOSIS — A419 Sepsis, unspecified organism: Secondary | ICD-10-CM | POA: Diagnosis present

## 2019-05-30 DIAGNOSIS — D709 Neutropenia, unspecified: Secondary | ICD-10-CM | POA: Diagnosis present

## 2019-05-30 DIAGNOSIS — C8330 Diffuse large B-cell lymphoma, unspecified site: Secondary | ICD-10-CM | POA: Diagnosis present

## 2019-05-30 DIAGNOSIS — Z7983 Long term (current) use of bisphosphonates: Secondary | ICD-10-CM | POA: Diagnosis not present

## 2019-05-30 DIAGNOSIS — Z20828 Contact with and (suspected) exposure to other viral communicable diseases: Secondary | ICD-10-CM | POA: Diagnosis present

## 2019-05-30 DIAGNOSIS — Z801 Family history of malignant neoplasm of trachea, bronchus and lung: Secondary | ICD-10-CM | POA: Diagnosis not present

## 2019-05-30 DIAGNOSIS — D703 Neutropenia due to infection: Secondary | ICD-10-CM | POA: Diagnosis present

## 2019-05-30 DIAGNOSIS — R509 Fever, unspecified: Secondary | ICD-10-CM | POA: Diagnosis present

## 2019-05-30 HISTORY — DX: Non-Hodgkin lymphoma, unspecified, unspecified site: C85.90

## 2019-05-30 LAB — URINALYSIS, COMPLETE (UACMP) WITH MICROSCOPIC
Bilirubin Urine: NEGATIVE
Glucose, UA: NEGATIVE mg/dL
Hgb urine dipstick: NEGATIVE
Ketones, ur: NEGATIVE mg/dL
Leukocytes,Ua: NEGATIVE
Nitrite: NEGATIVE
Protein, ur: NEGATIVE mg/dL
Specific Gravity, Urine: 1.008 (ref 1.005–1.030)
pH: 8 (ref 5.0–8.0)

## 2019-05-30 LAB — LACTIC ACID, PLASMA: Lactic Acid, Venous: 1.9 mmol/L (ref 0.5–1.9)

## 2019-05-30 LAB — BASIC METABOLIC PANEL
Anion gap: 8 (ref 5–15)
BUN: 12 mg/dL (ref 8–23)
CO2: 28 mmol/L (ref 22–32)
Calcium: 7.9 mg/dL — ABNORMAL LOW (ref 8.9–10.3)
Chloride: 106 mmol/L (ref 98–111)
Creatinine, Ser: 0.6 mg/dL (ref 0.44–1.00)
GFR calc Af Amer: >60 mL/min (ref >60)
GFR calc non Af Amer: >60 mL/min (ref >60)
Glucose, Bld: 119 mg/dL — ABNORMAL HIGH (ref 70–99)
Potassium: 3.2 mmol/L — ABNORMAL LOW (ref 3.5–5.1)
Sodium: 142 mmol/L (ref 135–145)

## 2019-05-30 LAB — CBC
HCT: 23.2 % — ABNORMAL LOW (ref 36.0–46.0)
Hemoglobin: 7.3 g/dL — ABNORMAL LOW (ref 12.0–15.0)
MCH: 27.9 pg (ref 26.0–34.0)
MCHC: 31.5 g/dL (ref 30.0–36.0)
MCV: 88.5 fL (ref 80.0–100.0)
Platelets: 12 10*3/uL — CL (ref 150–400)
RBC: 2.62 MIL/uL — ABNORMAL LOW (ref 3.87–5.11)
RDW: 16.4 % — ABNORMAL HIGH (ref 11.5–15.5)
WBC: 0.2 10*3/uL — CL (ref 4.0–10.5)
nRBC: 0 % (ref 0.0–0.2)

## 2019-05-30 LAB — SARS CORONAVIRUS 2 BY RT PCR (HOSPITAL ORDER, PERFORMED IN ~~LOC~~ HOSPITAL LAB): SARS Coronavirus 2: NEGATIVE

## 2019-05-30 LAB — ABO/RH: ABO/RH(D): O NEG

## 2019-05-30 LAB — PATHOLOGIST SMEAR REVIEW

## 2019-05-30 MED ORDER — VANCOMYCIN HCL IN DEXTROSE 1-5 GM/200ML-% IV SOLN
1000.0000 mg | INTRAVENOUS | Status: DC
  Administered 2019-05-31 – 2019-06-01 (×2): 1000 mg via INTRAVENOUS
  Filled 2019-05-30 (×3): qty 200

## 2019-05-30 MED ORDER — SODIUM CHLORIDE 0.9 % IV SOLN
2.0000 g | Freq: Three times a day (TID) | INTRAVENOUS | Status: DC
  Administered 2019-05-30 – 2019-06-01 (×7): 2 g via INTRAVENOUS
  Filled 2019-05-30 (×9): qty 2

## 2019-05-30 MED ORDER — SODIUM CHLORIDE 0.9 % IV SOLN
INTRAVENOUS | Status: DC | PRN
  Administered 2019-05-31: 06:00:00 250 mL via INTRAVENOUS
  Administered 2019-06-01: 05:00:00 10 mL via INTRAVENOUS

## 2019-05-30 MED ORDER — SODIUM CHLORIDE 0.9 % IV BOLUS
500.0000 mL | Freq: Once | INTRAVENOUS | Status: AC
  Administered 2019-05-30: 05:00:00 500 mL via INTRAVENOUS

## 2019-05-30 MED ORDER — FLUCONAZOLE 100 MG PO TABS
400.0000 mg | ORAL_TABLET | Freq: Every day | ORAL | Status: DC
  Administered 2019-05-30 – 2019-06-03 (×5): 400 mg via ORAL
  Filled 2019-05-30 (×5): qty 4

## 2019-05-30 MED ORDER — ATORVASTATIN CALCIUM 20 MG PO TABS
20.0000 mg | ORAL_TABLET | Freq: Every day | ORAL | Status: DC
  Administered 2019-05-30 – 2019-06-03 (×5): 20 mg via ORAL
  Filled 2019-05-30 (×5): qty 1

## 2019-05-30 MED ORDER — PANTOPRAZOLE SODIUM 40 MG PO TBEC
40.0000 mg | DELAYED_RELEASE_TABLET | Freq: Every day | ORAL | Status: DC
  Administered 2019-05-30 – 2019-06-03 (×5): 40 mg via ORAL
  Filled 2019-05-30 (×5): qty 1

## 2019-05-30 MED ORDER — LORAZEPAM 1 MG PO TABS: 1.0000 mg | ORAL_TABLET | Freq: Every evening | ORAL | Status: DC | PRN

## 2019-05-30 MED ORDER — SODIUM CHLORIDE 0.9 % IV BOLUS
1000.0000 mL | Freq: Once | INTRAVENOUS | Status: AC
  Administered 2019-05-30: 1000 mL via INTRAVENOUS

## 2019-05-30 MED ORDER — SODIUM CHLORIDE 0.9% IV SOLUTION
Freq: Once | INTRAVENOUS | Status: AC
  Administered 2019-05-30: 16:00:00 via INTRAVENOUS

## 2019-05-30 MED ORDER — SODIUM CHLORIDE 0.9 % IV SOLN: INTRAVENOUS | Status: DC

## 2019-05-30 MED ORDER — SODIUM CHLORIDE 0.9 % IV SOLN
INTRAVENOUS | Status: DC | PRN
  Administered 2019-05-30: 250 mL via INTRAVENOUS

## 2019-05-30 MED ORDER — VALACYCLOVIR HCL 500 MG PO TABS
500.0000 mg | ORAL_TABLET | Freq: Every day | ORAL | Status: DC
  Administered 2019-05-30 – 2019-06-03 (×5): 500 mg via ORAL
  Filled 2019-05-30 (×5): qty 1

## 2019-05-30 MED ORDER — SODIUM CHLORIDE 0.9 % IV SOLN
INTRAVENOUS | Status: DC | PRN
  Administered 2019-05-30: 250 mL via INTRAVENOUS
  Administered 2019-05-31: 10 mL via INTRAVENOUS

## 2019-05-30 MED ORDER — ALUM & MAG HYDROXIDE-SIMETH 200-200-20 MG/5ML PO SUSP
30.0000 mL | Freq: Four times a day (QID) | ORAL | Status: DC | PRN
  Administered 2019-05-30: 13:00:00 30 mL via ORAL
  Filled 2019-05-30: qty 30

## 2019-05-30 MED ORDER — METRONIDAZOLE IN NACL 5-0.79 MG/ML-% IV SOLN
500.0000 mg | Freq: Three times a day (TID) | INTRAVENOUS | Status: DC
  Administered 2019-05-30 – 2019-06-01 (×7): 500 mg via INTRAVENOUS
  Filled 2019-05-30 (×9): qty 100

## 2019-05-30 MED ORDER — FOLIC ACID 1 MG PO TABS
1.0000 mg | ORAL_TABLET | Freq: Every day | ORAL | Status: DC
  Administered 2019-05-30 – 2019-06-03 (×4): 1 mg via ORAL
  Filled 2019-05-30 (×4): qty 1

## 2019-05-30 MED ORDER — CITALOPRAM HYDROBROMIDE 20 MG PO TABS
20.0000 mg | ORAL_TABLET | Freq: Every day | ORAL | Status: DC
  Administered 2019-05-30 – 2019-06-03 (×5): 20 mg via ORAL
  Filled 2019-05-30 (×5): qty 1

## 2019-05-30 MED ORDER — ACETAMINOPHEN 325 MG PO TABS
650.0000 mg | ORAL_TABLET | Freq: Four times a day (QID) | ORAL | Status: DC | PRN
  Administered 2019-05-30 – 2019-05-31 (×2): 650 mg via ORAL
  Filled 2019-05-30 (×2): qty 2

## 2019-05-30 MED ORDER — ONDANSETRON HCL 4 MG/2ML IJ SOLN: 4.0000 mg | Freq: Four times a day (QID) | INTRAMUSCULAR | Status: DC | PRN

## 2019-05-30 MED ORDER — POTASSIUM CHLORIDE CRYS ER 20 MEQ PO TBCR
40.0000 meq | EXTENDED_RELEASE_TABLET | Freq: Once | ORAL | Status: AC
  Administered 2019-05-30: 40 meq via ORAL
  Filled 2019-05-30: qty 2

## 2019-05-30 MED ORDER — ONDANSETRON HCL 4 MG PO TABS: 4.0000 mg | ORAL_TABLET | Freq: Four times a day (QID) | ORAL | Status: DC | PRN

## 2019-05-30 MED ORDER — POTASSIUM CHLORIDE CRYS ER 20 MEQ PO TBCR
40.0000 meq | EXTENDED_RELEASE_TABLET | Freq: Once | ORAL | Status: AC
  Administered 2019-05-30: 10:00:00 40 meq via ORAL
  Filled 2019-05-30: qty 2

## 2019-05-30 MED ORDER — ACETAMINOPHEN 650 MG RE SUPP: 650.0000 mg | Freq: Four times a day (QID) | RECTAL | Status: DC | PRN

## 2019-05-30 NOTE — Progress Notes (Signed)
   05/30/19 1500  Clinical Encounter Type  Visited With Patient and family together  Visit Type Initial;Other (Comment) (Advance Directives Education)  Referral From Nurse  Spiritual Encounters  Spiritual Needs Emotional  Stress Factors  Patient Stress Factors Health changes  Ch visited to talk about Advance Directives. Daughter Andee Poles was at bedside. Pt said that she had filled out a HCPOA form while she was at another facility. Ch asked to bring the copy in so [facility] can have the copy on file. Pt said she was feeling better than yesterday and that she relies on God. Ch gave her well wishes and will follow up with her the next day.

## 2019-05-30 NOTE — Progress Notes (Signed)
[place] [facility] [place] NAME: Emanuella Nickle    MR#:  [id number]  DATE OF BIRTH:  10-01-1946  SUBJECTIVE:  CHIEF COMPLAINT:   Chief Complaint  Patient presents with  . Fever   No new complaint this morning.  Patient sitting up in bed and having breakfast.  Noted to have had fever with temperature 101 last night.  REVIEW OF SYSTEMS:  Review of Systems  Constitutional: Positive for fever. Negative for chills.  HENT: Negative for hearing loss and tinnitus.   Eyes: Negative for blurred vision and double vision.  Respiratory: Negative for cough and shortness of breath.   Cardiovascular: Negative for chest pain and palpitations.  Gastrointestinal: Negative for abdominal pain, diarrhea, heartburn, nausea and vomiting.  Genitourinary: Negative for dysuria and urgency.  Musculoskeletal: Negative for myalgias and neck pain.  Skin: Negative for itching and rash.  Neurological: Negative for dizziness and headaches.  Psychiatric/Behavioral: Negative for depression and hallucinations.    DRUG ALLERGIES:   Allergies  Allergen Reactions  . Ace Inhibitors Swelling    enalapril   VITALS:  Blood pressure (!) 142/63, pulse 85, temperature 100.1 F (37.8 C), temperature source Oral, resp. rate 18, height 5\' 4"  (1.626 m), weight 83.5 kg, SpO2 100 %. PHYSICAL EXAMINATION:  Physical Exam  Constitutional: She is oriented to person, place, and time. She appears well-developed.  HENT:  Head: Normocephalic and atraumatic.  Right Ear: External ear normal.  Eyes: Pupils are equal, round, and reactive to light. Conjunctivae are normal. Right eye exhibits no discharge.  Neck: Normal range of motion. No tracheal deviation present. No thyromegaly present.  Cardiovascular: Normal rate, regular rhythm and normal heart sounds.  Respiratory: Effort normal and breath sounds normal. No respiratory distress.  GI: Soft. She exhibits no distension. There is no abdominal  tenderness.  Musculoskeletal: Normal range of motion.        General: No edema.  Neurological: She is alert and oriented to person, place, and time. No cranial nerve deficit.  Skin: Skin is warm. She is not diaphoretic. No erythema.  Psychiatric: She has a normal mood and affect. Her behavior is normal.    LABORATORY PANEL:  Female CBC Recent Labs  Lab 05/30/19 0537  WBC 0.2*  HGB 7.3*  HCT 23.2*  PLT 12*   ------------------------------------------------------------------------------------------------------------------ Chemistries  Recent Labs  Lab 05/29/19 2209 05/30/19 0537  NA 137 142  K 3.1* 3.2*  CL 96* 106  CO2 28 28  GLUCOSE 160* 119*  BUN 16 12  CREATININE 0.76 0.60  CALCIUM 9.1 7.9*  AST 20  --   ALT 18  --   ALKPHOS 54  --   BILITOT 1.1  --    RADIOLOGY:  Dg Chest Portable 1 View  Result Date: 05/29/2019 CLINICAL DATA:  Neutropenic fever EXAM: PORTABLE CHEST 1 VIEW COMPARISON:  December 28, 2017 FINDINGS: There is a dual chamber right-sided Port-A-Cath that is well positioned. The heart size is stable. There is no focal infiltrate. No large pleural effusion. No acute osseous abnormality. IMPRESSION: No active disease. Electronically Signed   By: Constance Holster M.D.   On: 05/29/2019 23:56   ASSESSMENT AND PLAN:   1.  Probable sepsis; present on admission but source not yet found. Patient on broad-spectrum IV antibiotics and IV fluids. So far no growth on cultures.  Chest x-ray and urinalysis negative. Follow-up on blood cultures.  Lactic acid level trended down from 3.9-1.9  2.  Febrile neutropenia  Patient presented with fevers with temperature of 101. Patient with history of lymphoma currently on chemotherapy for which she follows up with [facility]. Patient on broad-spectrum IV antibiotics with vancomycin, cefepime and Flagyl pending results of cultures. Monitor clinically  3.  Pancytopenia Platelet count down to 12,000 this morning.  Requested for 2 units  of platelets transfusion today.  Patient stated she was scheduled to go to [facility] today as well for blood transfusion. Hemoglobin of 7.3.  WBC count of 0.2 Follow-up CBC in a.m. Oncology consult already placed.  Follow-up on evaluation and recommendations  4.  Recent diagnosis of lymphoma Patient on chemotherapy at [facility]. Follow-up on oncology input  5.  Hypertension -blood pressure fairly controlled.  To resume home blood pressure meds if blood pressure begins to trend up    6.GERD (gastroesophageal reflux disease) -home dose PPI  GI prophylaxis; SCDs No heparin products due to thrombocytopenia  All the records are reviewed and case discussed with Care Management/Social Worker. Management plans discussed with the patient, and she is in agreement.  CODE STATUS: Full Code  TOTAL TIME TAKING CARE OF THIS PATIENT: 34 minutes.   More than 50% of the time was spent in counseling/coordination of care: YES  POSSIBLE D/C IN 2 DAYS, DEPENDING ON CLINICAL CONDITION.   Barbara Ahart M.D on 05/30/2019 at 11:50 AM  Between 7am to 6pm - Pager - [phone]  After 6pm go to [email or web address] - Proofreader  [employer] [facility]  Office  [phone]  CC: Primary care physician; Perrin Maltese, MD  Note: This dictation was prepared with Dragon dictation along with smaller phrase technology. Any transcriptional errors that result from this process are unintentional.

## 2019-05-30 NOTE — ED Notes (Signed)
Answered pt's call bell; voiced need to void; pt unhooked from monitor and ambulated to toilet in room without difficulty; asking about admission bed assignment; will notify pt we are still waiting on bed placement;

## 2019-05-30 NOTE — Consult Note (Signed)
Pharmacy Antibiotic Note  Mercedes Vasquez is a 73 y.o. female admitted on 05/29/2019 with sepsis and neutropenic fever. Patient has PMH of Lymphoma.  Pharmacy has been consulted for vancomycin and cefepime dosing.  Plan: Vancomycin 1750mg  IV x 1 dose. Start Vancomycin 1000 mg IV Q 24 hrs. Goal AUC 400-550. Expected AUC: 466 SCr used: 0.8  Start Cefepime 2g IV every 8 hours.   Height: 5\' 4"  (162.6 cm) Weight: 184 lb (83.5 kg) IBW/kg (Calculated) : 54.7  Temp (24hrs), Avg:100 F (37.8 C), Min:99 F (37.2 C), Max:101 F (38.3 C)  Recent Labs  Lab 05/29/19 2209  WBC 0.2*  CREATININE 0.76  LATICACIDVEN 3.9*    Estimated Creatinine Clearance: 68.4 mL/min (by C-G formula based on SCr of 0.76 mg/dL).    Allergies  Allergen Reactions  . Ace Inhibitors Swelling    enalapril    Antimicrobials this admission: 8/9 vancomycin >>  8/9 cefepime >>   Microbiology results: 8/9 BCx: pending 8/9 UCx: pending  Thank you for allowing pharmacy to be a part of this patient's care.  Pernell Dupre, PharmD, BCPS Clinical Pharmacist 05/30/2019 2:47 AM

## 2019-05-30 NOTE — ED Notes (Signed)
ED TO INPATIENT HANDOFF REPORT  ED Nurse Name and Phone #:  Arrie Aran [phone]  S Name/Age/Gender Mercedes Vasquez 73 y.o. female Room/Bed: ED12A/ED12A  Code Status   Code Status: Not on file  Home/SNF/Other Home Patient oriented to: self Is this baseline? Patient is alert and oriented x4  Triage Complete: Triage complete  Chief Complaint Fever  Triage Note Pt here with neutropenia and fever. Pt states her oncologist told her to come to ED. Pt states last tylenol at 1900. Pt state she feels weak and tired.    Allergies Allergies  Allergen Reactions  . Ace Inhibitors Swelling    enalapril    Level of Care/Admitting Diagnosis ED Disposition    ED Disposition Condition [facility] Area: [place] [[id number]]  Level of Care: Med-Surg [16]  Covid Evaluation: Confirmed COVID Negative  Diagnosis: Sepsis [facility]) [[id number]]  Admitting Physician: Lance Coon [[id number]]  Attending Physician: Lance Coon [phone]  Estimated length of stay: past midnight tomorrow  Certification:: I certify this patient will need inpatient services for at least 2 midnights  PT Class (Do Not Modify): Inpatient [101]  PT Acc Code (Do Not Modify): Private [1]       B Medical/Surgery History Past Medical History:  Diagnosis Date  . Anemia   . Anxiety   . GERD (gastroesophageal reflux disease)   . Heart murmur   . Hypercholesteremia   . Hypertension    controlled on meds  . Lymphoma ([place]) 05/30/2019  . Osteoporosis   . Pancytopenia ([place])   . Wears dentures    lower   Past Surgical History:  Procedure Laterality Date  . ABDOMINAL HYSTERECTOMY    . BROW LIFT Bilateral 03/09/2018   Procedure: BLEPHAROPLASTY REPAIR RESECT;  Surgeon: Karle Starch, MD;  Location: [place];  Service: Ophthalmology;  Laterality: Bilateral;  . CATARACT EXTRACTION W/PHACO Right 10/29/2016   Procedure: CATARACT EXTRACTION PHACO AND INTRAOCULAR LENS PLACEMENT (IOC);   Surgeon: Estill Cotta, MD;  Location: [facility];  Service: Ophthalmology;  Laterality: Right;  [place]  01:04 AP% 24.1 CDE 28.71 fluid pack lot # [id number]  . COLONOSCOPY    . FOOT SURGERY     bunion  . PTOSIS REPAIR Bilateral 03/09/2018   Procedure: PTOSIS REPAIR;  Surgeon: Karle Starch, MD;  Location: [place];  Service: Ophthalmology;  Laterality: Bilateral;     A IV Location/Drains/Wounds Patient Lines/Drains/Airways Status   Active Line/Drains/Airways    Name:   Placement date:   Placement time:   Site:   Days:   Peripheral IV 05/29/19 Right Antecubital   05/29/19    2208    Antecubital   1   Incision (Closed) 10/29/16 Eye Right   10/29/16    1018     943   Incision (Closed) 03/09/18 Eye Right   03/09/18    1336     447   Incision (Closed) 03/09/18 Eye Left   03/09/18    1336     447          Intake/Output Last 24 hours  Intake/Output Summary (Last 24 hours) at 05/30/2019 0303 Last data filed at 05/30/2019 0142 Gross per 24 hour  Intake 482.88 ml  Output -  Net 482.88 ml    Labs/Imaging Results for orders placed or performed during the hospital encounter of 05/29/19 (from the past 48 hour(s))  Urinalysis, Complete w Microscopic     Status: Abnormal   Collection Time: 05/29/19  9:50 PM  Result Value Ref Range   Color, Urine STRAW (A) YELLOW   APPearance CLEAR (A) CLEAR   Specific Gravity, Urine 1.008 1.005 - 1.030   pH 8.0 5.0 - 8.0   Glucose, UA NEGATIVE NEGATIVE mg/dL   Hgb urine dipstick NEGATIVE NEGATIVE   Bilirubin Urine NEGATIVE NEGATIVE   Ketones, ur NEGATIVE NEGATIVE mg/dL   Protein, ur NEGATIVE NEGATIVE mg/dL   Nitrite NEGATIVE NEGATIVE   Leukocytes,Ua NEGATIVE NEGATIVE   RBC / HPF 0-5 0 - 5 RBC/hpf   WBC, UA 0-5 0 - 5 WBC/hpf   Bacteria, UA RARE (A) NONE SEEN   Squamous Epithelial / LPF 0-5 0 - 5    Comment: Performed at [facility], [address] [place], [place] [address]  Comprehensive metabolic panel     Status: Abnormal    Collection Time: 05/29/19 10:09 PM  Result Value Ref Range   Sodium 137 135 - 145 mmol/L   Potassium 3.1 (L) 3.5 - 5.1 mmol/L   Chloride 96 (L) 98 - 111 mmol/L   CO2 28 22 - 32 mmol/L   Glucose, Bld 160 (H) 70 - 99 mg/dL   BUN 16 8 - 23 mg/dL   Creatinine, Ser 0.76 0.44 - 1.00 mg/dL   Calcium 9.1 8.9 - 10.3 mg/dL   Total Protein 6.5 6.5 - 8.1 g/dL   Albumin 3.7 3.5 - 5.0 g/dL   AST 20 15 - 41 U/L   ALT 18 0 - 44 U/L   Alkaline Phosphatase 54 38 - 126 U/L   Total Bilirubin 1.1 0.3 - 1.2 mg/dL   GFR calc non Af Amer >60 >60 mL/min   GFR calc Af Amer >60 >60 mL/min   Anion gap 13 5 - 15    Comment: Performed at [facility], [address] [place], [place] [address]  Lactic acid, plasma     Status: Abnormal   Collection Time: 05/29/19 10:09 PM  Result Value Ref Range   Lactic Acid, Venous 3.9 (HH) 0.5 - 1.9 mmol/L    Comment: CRITICAL RESULT CALLED TO, READ BACK BY AND VERIFIED WITH Sumayah Bearse @2251  ON 05/29/2019 BY FMW Performed at [facility], [address] [place], [place] [address]   CBC with Differential     Status: Abnormal   Collection Time: 05/29/19 10:09 PM  Result Value Ref Range   WBC 0.2 (LL) 4.0 - 10.5 K/uL    Comment: REPEATED TO VERIFY THIS CRITICAL RESULT HAS VERIFIED AND BEEN CALLED TO Datron Brakebill BY ROBIN [facility] ON 08 09 2020 AT 2238, AND HAS BEEN READ BACK.     RBC 3.08 (L) 3.87 - 5.11 MIL/uL   Hemoglobin 8.7 (L) 12.0 - 15.0 g/dL   HCT 26.9 (L) 36.0 - 46.0 %   MCV 87.3 80.0 - 100.0 fL   MCH 28.2 26.0 - 34.0 pg   MCHC 32.3 30.0 - 36.0 g/dL   RDW 16.4 (H) 11.5 - 15.5 %   Platelets 19 (LL) 150 - 400 K/uL    Comment: Immature Platelet Fraction may be clinically indicated, consider ordering this additional test [id number] THIS CRITICAL RESULT HAS VERIFIED AND BEEN CALLED TO Etola Mull BY ROBIN [facility] ON 08 09 2020 AT 2238, AND HAS BEEN READ BACK.     nRBC 0.0 0.0 - 0.2 %   Neutrophils Relative % 0 %   Neutro Abs 0.0 (L) 1.7 -  7.7 K/uL   Lymphocytes Relative 95 %   Lymphs Abs  0.2 (L) 0.7 - 4.0 K/uL   Monocytes Relative 0 %   Monocytes Absolute 0.0 (L) 0.1 - 1.0 K/uL   Eosinophils Relative 5 %   Eosinophils Absolute 0.0 0.0 - 0.5 K/uL   Basophils Relative 0 %   Basophils Absolute 0.0 0.0 - 0.1 K/uL   WBC Morphology MORPHOLOGY UNREMARKABLE    Smear Review Normal platelet morphology    Immature Granulocytes 0 %   Abs Immature Granulocytes 0.00 0.00 - 0.07 K/uL   Schistocytes PRESENT     Comment: Performed at [facility], [address] [place], [place] [address]  Protime-INR     Status: None   Collection Time: 05/29/19 10:09 PM  Result Value Ref Range   Prothrombin Time 13.4 11.4 - 15.2 seconds   INR 1.0 0.8 - 1.2    Comment: (NOTE) INR goal varies based on device and disease states. Performed at [facility], [address] [place], [place] [address]   SARS Coronavirus 2 [facility] order, Performed in [facility] hospital lab) Nasopharyngeal Nasopharyngeal Swab     Status: None   Collection Time: 05/29/19 11:13 PM   Specimen: Nasopharyngeal Swab  Result Value Ref Range   SARS Coronavirus 2 NEGATIVE NEGATIVE    Comment: (NOTE) If result is NEGATIVE SARS-CoV-2 target nucleic acids are NOT DETECTED. The SARS-CoV-2 RNA is generally detectable in upper and lower  respiratory specimens during the acute phase of infection. The lowest  concentration of SARS-CoV-2 viral copies this assay can detect is 250  copies / mL. A negative result does not preclude SARS-CoV-2 infection  and should not be used as the sole basis for treatment or other  patient management decisions.  A negative result may occur with  improper specimen collection / handling, submission of specimen other  than nasopharyngeal swab, presence of viral mutation(s) within the  areas targeted by this assay, and inadequate number of viral copies  (<250 copies / mL). A negative result must be combined with clinical   observations, patient history, and epidemiological information. If result is POSITIVE SARS-CoV-2 target nucleic acids are DETECTED. The SARS-CoV-2 RNA is generally detectable in upper and lower  respiratory specimens dur ing the acute phase of infection.  Positive  results are indicative of active infection with SARS-CoV-2.  Clinical  correlation with patient history and other diagnostic information is  necessary to determine patient infection status.  Positive results do  not rule out bacterial infection or co-infection with other viruses. If result is PRESUMPTIVE POSTIVE SARS-CoV-2 nucleic acids MAY BE PRESENT.   A presumptive positive result was obtained on the submitted specimen  and confirmed on repeat testing.  While 2019 novel coronavirus  (SARS-CoV-2) nucleic acids may be present in the submitted sample  additional confirmatory testing may be necessary for epidemiological  and / or clinical management purposes  to differentiate between  SARS-CoV-2 and other Sarbecovirus currently known to infect humans.  If clinically indicated additional testing with an alternate test  methodology [phone]) is advised. The SARS-CoV-2 RNA is generally  detectable in upper and lower respiratory sp ecimens during the acute  phase of infection. The expected result is Negative. Fact Sheet for Patients:  StrictlyIdeas.no Fact Sheet for Healthcare Providers: BankingDealers.co.za This test is not yet approved or cleared by the [place] FDA and has been authorized for detection and/or diagnosis of SARS-CoV-2 by FDA under an Emergency Use Authorization (EUA).  This EUA will remain in effect (meaning this test can be used) for the  duration of the COVID-19 declaration under Section 564(b)(1) of the Act, 21 U.S.C. section 360bbb-3(b)(1), unless the authorization is terminated or revoked sooner. Performed at [facility], [address] [place], [place] [address]   Lactic acid, plasma     Status: None   Collection Time: 05/30/19  2:27 AM  Result Value Ref Range   Lactic Acid, Venous 1.9 0.5 - 1.9 mmol/L    Comment: Performed at [facility], [address] [place], [place] [address]   Dg Chest Portable 1 View  Result Date: 05/29/2019 CLINICAL DATA:  Neutropenic fever EXAM: PORTABLE CHEST 1 VIEW COMPARISON:  December 28, 2017 FINDINGS: There is a dual chamber right-sided Port-A-Cath that is well positioned. The heart size is stable. There is no focal infiltrate. No large pleural effusion. No acute osseous abnormality. IMPRESSION: No active disease. Electronically Signed   By: Constance Holster M.D.   On: 05/29/2019 23:56    Pending Labs Unresulted Labs (From admission, onward)    Start     Ordered   05/29/19 2301  Urine culture  ONCE - STAT,   STAT     05/29/19 2301   05/29/19 2209  Pathologist smear review  Once,   STAT     05/29/19 2209   05/29/19 2200  Culture, blood (Routine x 2)  BLOOD CULTURE X 2,   STAT     05/29/19 2159   Signed and Held  Basic metabolic panel  Tomorrow morning,   R     Signed and Held   Signed and Held  CBC  Tomorrow morning,   R     Signed and Held          Vitals/Pain Today's Vitals   05/29/19 2157 05/29/19 2215 05/29/19 2239 05/30/19 0230  BP: 121/63 138/75 130/71   Pulse: 82     Resp: 20     Temp: (!) 101 F (38.3 C)   99 F (37.2 C)  TempSrc: Oral   Oral  SpO2: 100%     Weight: 83.5 kg     Height: 5\' 4"  (1.626 m)     PainSc: 5        Isolation Precautions No active isolations  Medications Medications  metroNIDAZOLE (FLAGYL) IVPB 500 mg (has no administration in time range)  sodium chloride 0.9 % bolus 1,000 mL (has no administration in time range)    Followed by  sodium chloride 0.9 % bolus 1,000 mL (has no administration in time range)    Followed by  sodium chloride 0.9 % bolus 500 mL (has no administration in time range)  potassium chloride SA (K-DUR)  CR tablet 40 mEq (has no administration in time range)  vancomycin (VANCOCIN) IVPB 1000 mg/200 mL premix (has no administration in time range)  ceFEPIme (MAXIPIME) 2 g in sodium chloride 0.9 % 100 mL IVPB (has no administration in time range)  ceFEPIme (MAXIPIME) 2 g in sodium chloride 0.9 % 100 mL IVPB (0 g Intravenous Stopped 05/29/19 2340)  acetaminophen (TYLENOL) tablet 650 mg (650 mg Oral Given 05/29/19 2338)  vancomycin (VANCOCIN) 1,750 mg in sodium chloride 0.9 % 500 mL IVPB (0 mg Intravenous Stopped 05/30/19 0142)    Mobility walks Low fall risk   Focused Assessments Pulmonary Assessment Handoff:  Lung sounds:   O2 Device: Room Air        R Recommendations: See Admitting Provider Note  Report given to:   Additional Notes: -

## 2019-05-30 NOTE — ED Notes (Addendum)
Pt's daughter very concerned regarding wait for admission bed. Daughter states " I should have taken her to [facility], it's so much faster".

## 2019-05-30 NOTE — H&P (Signed)
[place] [facility] [place] NAME: Mercedes Vasquez    MR#:  [id number]  DATE OF BIRTH:  May 29, 1946  DATE OF ADMISSION:  05/29/2019  PRIMARY CARE PHYSICIAN: Perrin Maltese, MD   REQUESTING/REFERRING PHYSICIAN: Owens Shark, MD  CHIEF COMPLAINT:   Chief Complaint  Patient presents with  . Fever    HISTORY OF PRESENT ILLNESS:  Mercedes Vasquez  is a 73 y.o. female who presents with chief complaint as above.  Patient presents the ED with a complaint of fever.  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  Her oncology care is at [facility].  No clear source of infection is identified in the ED here tonight, but her [place] is 0 and she was febrile to 101 here in our ED.  She was given broad-spectrum antibiotics and hospitalist called for admission  PAST MEDICAL HISTORY:   Past Medical History:  Diagnosis Date  . Anemia   . Anxiety   . GERD (gastroesophageal reflux disease)   . Heart murmur   . Hypercholesteremia   . Hypertension    controlled on meds  . Lymphoma ([place]) 05/30/2019  . Osteoporosis   . Pancytopenia ([place])   . Wears dentures    lower     PAST SURGICAL HISTORY:   Past Surgical History:  Procedure Laterality Date  . ABDOMINAL HYSTERECTOMY    . BROW LIFT Bilateral 03/09/2018   Procedure: BLEPHAROPLASTY REPAIR RESECT;  Surgeon: Karle Starch, MD;  Location: [place];  Service: Ophthalmology;  Laterality: Bilateral;  . CATARACT EXTRACTION W/PHACO Right 10/29/2016   Procedure: CATARACT EXTRACTION PHACO AND INTRAOCULAR LENS PLACEMENT (IOC);  Surgeon: Estill Cotta, MD;  Location: [facility];  Service: Ophthalmology;  Laterality: Right;  [place]  01:04 AP% 24.1 CDE 28.71 fluid pack lot # [id number]  . COLONOSCOPY    . FOOT SURGERY     bunion  . PTOSIS REPAIR Bilateral 03/09/2018   Procedure: PTOSIS REPAIR;  Surgeon: Karle Starch, MD;  Location: [place];  Service: Ophthalmology;   Laterality: Bilateral;     SOCIAL HISTORY:   Social History   Tobacco Use  . Smoking status: Former Smoker    Types: Cigarettes    Quit date: 09/19/1969    Years since quitting: 49.7  . Smokeless tobacco: Never Used  Substance Use Topics  . Alcohol use: No     FAMILY HISTORY:   Family History  Problem Relation Age of Onset  . Pancreatic cancer Mother   . Heart attack Maternal Aunt   . Lung cancer Cousin   . Lung cancer Cousin   . Breast cancer Neg Hx      DRUG ALLERGIES:   Allergies  Allergen Reactions  . Ace Inhibitors Swelling    enalapril    MEDICATIONS AT HOME:   Prior to Admission medications   Medication Sig Start Date End Date Taking? Authorizing Provider  acetaminophen (TYLENOL) 500 MG tablet Take 1,000 mg by mouth every 6 (six) hours.   Yes [provider]  allopurinol (ZYLOPRIM) 300 MG tablet Take 300 mg by mouth daily. 05/25/19  Yes [provider]  amLODipine (NORVASC) 5 MG tablet Take 5 mg by mouth daily. 11am   Yes [provider]  atorvastatin (LIPITOR) 20 MG tablet Take 20 mg by mouth daily. 11am   Yes [provider]  cetirizine (ZYRTEC) 10 MG tablet Take 10 mg by mouth at bedtime.    Yes [provider]  citalopram (CELEXA) 20 MG tablet Take 20 mg by mouth daily.   Yes [provider]  docusate sodium (COLACE) 100 MG capsule Take 1 tablet once or twice daily as needed for constipation while taking narcotic pain medicine Patient taking differently: Take 100 mg by mouth daily as needed (constipation).  06/22/16  Yes Hinda Kehr, MD  ergocalciferol (VITAMIN D2) 1.25 MG (50000 UT) capsule Take 50,000 Units by mouth every Friday.    Yes [provider]  fluconazole (DIFLUCAN) 200 MG tablet Take 400 mg by mouth daily. 05/25/19  Yes [provider]  fluticasone (FLONASE) 50 MCG/ACT nasal spray Place 2 sprays into the nose daily as needed for allergies.   Yes [provider]   folic acid (FOLVITE) 1 MG tablet Take 1 mg by mouth daily.  04/19/19  Yes [provider]  gabapentin (NEURONTIN) 100 MG capsule Take 100 mg by mouth 2 (two) times daily. 01/27/19  Yes [provider]  hydrochlorothiazide (MICROZIDE) 12.5 MG capsule Take 12.5 mg by mouth daily.   Yes [provider]  ibandronate (BONIVA) 150 MG tablet Take 150 mg by mouth every 30 (thirty) days. Take in the morning with a full glass of water, on an empty stomach, and do not take anything else by mouth or lie down for the next 30 min.   Yes [provider]  Lactobacillus (PROBIOTIC ACIDOPHILUS PO) Take 1 capsule by mouth daily.   Yes [provider]  levofloxacin (LEVAQUIN) 500 MG tablet Take 500 mg by mouth daily. 05/21/19  Yes [provider]  LORazepam (ATIVAN) 1 MG tablet Take 1 mg by mouth at bedtime as needed for sleep.   Yes [provider]  Omega-3 Fatty Acids (FISH OIL) 1000 MG CPDR Take 1 capsule by mouth daily.   Yes [provider]  omeprazole (PRILOSEC) 40 MG capsule Take 40 mg by mouth daily.    Yes [provider]  potassium chloride SA (K-DUR,KLOR-CON) 20 MEQ tablet Take 20 mEq by mouth daily.   Yes [provider]  valACYclovir (VALTREX) 500 MG tablet Take 500 mg by mouth daily. 05/25/19  Yes [provider]    REVIEW OF SYSTEMS:  Review of Systems  Constitutional: Positive for fever. Negative for chills, malaise/fatigue and weight loss.  HENT: Negative for ear pain, hearing loss and tinnitus.   Eyes: Negative for blurred vision, double vision, pain and redness.  Respiratory: Negative for cough, hemoptysis and shortness of breath.   Cardiovascular: Negative for chest pain, palpitations, orthopnea and leg swelling.  Gastrointestinal: Negative for abdominal pain, constipation, diarrhea, nausea and vomiting.  Genitourinary: Negative for dysuria, frequency and hematuria.  Musculoskeletal: Negative for back  pain, joint pain and neck pain.  Skin:       No acne, rash, or lesions  Neurological: Negative for dizziness, tremors, focal weakness and weakness.  Endo/Heme/Allergies: Negative for polydipsia. Does not bruise/bleed easily.  Psychiatric/Behavioral: Negative for depression. The patient is not nervous/anxious and does not have insomnia.      VITAL SIGNS:   Vitals:   05/29/19 2157 05/29/19 2215 05/29/19 2239  BP: 121/63 138/75 130/71  Pulse: 82    Resp: 20    Temp: (!) 101 F (38.3 C)    TempSrc: Oral    SpO2: 100%    Weight: 83.5 kg    Height: 5\' 4"  (1.626 m)     Wt Readings from Last 3 Encounters:  05/29/19 83.5 kg  05/06/19 79.4 kg  04/25/19 81.5 kg    PHYSICAL EXAMINATION:  Physical Exam  Vitals reviewed. Constitutional: She is oriented to person, place, and time. She appears well-developed and well-nourished. No distress.  HENT:  Head: Normocephalic and atraumatic.  Mouth/Throat: Oropharynx is clear and moist.  Eyes: Pupils are equal, round, and reactive to light. Conjunctivae and EOM are normal. No scleral icterus.  Neck: Normal range of motion. Neck supple. No JVD present. No thyromegaly present.  Cardiovascular: Normal rate, regular rhythm and intact distal pulses. Exam reveals no gallop and no friction rub.  No murmur heard. Respiratory: Effort normal and breath sounds normal. No respiratory distress. She has no wheezes. She has no rales.  GI: Soft. Bowel sounds are normal. She exhibits no distension. There is no abdominal tenderness.  Musculoskeletal: Normal range of motion.        General: No edema.     Comments: No arthritis, no gout  Lymphadenopathy:    She has no cervical adenopathy.  Neurological: She is alert and oriented to person, place, and time. No cranial nerve deficit.  No dysarthria, no aphasia  Skin: Skin is warm and dry. No rash noted. No erythema.  Psychiatric: She has a normal mood and affect. Her behavior is normal. Judgment and thought  content normal.    LABORATORY PANEL:   CBC Recent Labs  Lab 05/29/19 2209  WBC 0.2*  HGB 8.7*  HCT 26.9*  PLT 19*   ------------------------------------------------------------------------------------------------------------------  Chemistries  Recent Labs  Lab 05/29/19 2209  NA 137  K 3.1*  CL 96*  CO2 28  GLUCOSE 160*  BUN 16  CREATININE 0.76  CALCIUM 9.1  AST 20  ALT 18  ALKPHOS 54  BILITOT 1.1   ------------------------------------------------------------------------------------------------------------------  Cardiac Enzymes No results for input(s): TROPONINI in the last 168 hours. ------------------------------------------------------------------------------------------------------------------  RADIOLOGY:  Dg Chest Portable 1 View  Result Date: 05/29/2019 CLINICAL DATA:  Neutropenic fever EXAM: PORTABLE CHEST 1 VIEW COMPARISON:  December 28, 2017 FINDINGS: There is a dual chamber right-sided Port-A-Cath that is well positioned. The heart size is stable. There is no focal infiltrate. No large pleural effusion. No acute osseous abnormality. IMPRESSION: No active disease. Electronically Signed   By: Constance Holster M.D.   On: 05/29/2019 23:56    EKG:   Orders placed or performed during the hospital encounter of 05/29/19  . ED EKG 12-Lead  . ED EKG 12-Lead    IMPRESSION AND PLAN:  Principal Problem:   Sepsis ([place]) -unclear source, but with the patient's neutropenia she may likely be unable to mount a significant enough response.  Broad-spectrum antibiotics given.  Lactic acid was elevated at 3.9, IV fluids ordered and will trend lactic acid until within normal limits, cultures sent Active Problems:   Lymphoma ([place]) -recently received chemotherapy, currently pancytopenic due to the same, see below.  Oncology consult   Neutropenic fever ([place]) -IV antibiotics as above, close monitoring, neutropenic precautions, oncology consult   Antineoplastic chemotherapy  induced pancytopenia ([place]) -patient was scheduled for some transfusion at [facility] tomorrow.  Currently her hemoglobin is 8.7, however this may fall by tomorrow.  Recheck with morning labs.  Her platelets are 19,000.  Oncology consult as above to help guide need for transfusions versus other post-chemotherapy treatment protocols   Hypertension -home dose antihypertensives   GERD (gastroesophageal reflux disease) -home dose PPI   HLD (hyperlipidemia) -home dose antilipid  Chart review performed and case discussed with ED provider. Labs, imaging and/or ECG reviewed by provider and discussed with patient/family.  Management plans discussed with the patient and/or family.  COVID-19 status: Tested negative     DVT PROPHYLAXIS: Mechanical only  GI PROPHYLAXIS:  PPI   ADMISSION STATUS: Inpatient     CODE STATUS: Full  TOTAL TIME TAKING CARE OF THIS PATIENT: 45 minutes.   This patient was evaluated in the context of the global COVID-19 pandemic, which necessitated consideration that the patient might be at risk for infection with the SARS-CoV-2 virus that causes COVID-19. Institutional protocols and algorithms that pertain to the evaluation of patients at risk for COVID-19 are in a state of rapid change based on information released by regulatory bodies including the CDC and federal and state organizations. These policies and algorithms were followed to the best of this provider's knowledge to date during the patient's care at this facility.  Ethlyn Daniels 05/30/2019, 1:28 AM  [facility]  Office  [phone]  CC: Primary care physician; Perrin Maltese, MD  Note:  This document was prepared using Dragon voice recognition software and may include unintentional dictation errors.

## 2019-05-30 NOTE — Consult Note (Signed)
Hematology/Oncology Consult note [facility] Telephone:([phone] [phone]  Patient Care Team: Perrin Maltese, MD as PCP - General (Internal Medicine)   Name of the patient: Mercedes Vasquez  [id number]  1945-12-21    Reason for consult: Neutropenic fever   Requesting physician: Dr. 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  Patient was sent to [facility] given her diagnosis of possible B-cell AL L.  She was treated as a high-grade B-cell lymphoma and underwent dose adjusted [place] chemotherapy starting 05/21/2019.  She received chemotherapy from [address]86 2020 and received Rituxan on 8 7.  She also received Neulasta on 8/7  Patient currently admitted to the hospital after she was noted to have a fever at home. For neutropenic fever with broad-spectrum antibiotics including cefepime and vancomycin as well as Flagyl..  Per T-max was 101 at 2157 yesterday and since then she has been afebrile.  She is hemodynamically stable.  COVID testing was negative.  Lactic acid was initially elevated at 3.9 which then normalized to 1.9.  Blood cultures so far have shown no growth.  Urinalysis was negative.  Chest x-ray showed no pneumonia.  She is currently being treated  Patient reports that she felt her body was on fire after she received Rituxan.  Currently she reports some fatigue but denies other complaints  ECOG PS- 1  Pain scale- 0   Review of systems- Review of Systems  Constitutional: Positive for malaise/fatigue.    Allergies  Allergen Reactions  . Ace Inhibitors Swelling    enalapril    Patient Active Problem List   Diagnosis Date Noted  . Lymphoma ([place]) 05/30/2019  . Neutropenic fever ([place]) 05/30/2019  . GERD  (gastroesophageal reflux disease) 05/30/2019  . HLD (hyperlipidemia) 05/30/2019  . Sepsis ([place]) 05/30/2019  . Antineoplastic chemotherapy induced pancytopenia ([place]) 05/30/2019  . Hypertension 04/25/2019  . Patellofemoral stress syndrome 02/11/2019     Past Medical History:  Diagnosis Date  . Anemia   . Anxiety   . GERD (gastroesophageal reflux disease)   . Heart murmur   . Hypercholesteremia   . Hypertension    controlled on meds  . Lymphoma ([place]) 05/30/2019  . Osteoporosis   . Pancytopenia ([place])   . Wears dentures    lower     Past Surgical History:  Procedure Laterality Date  . ABDOMINAL HYSTERECTOMY    . BROW LIFT Bilateral 03/09/2018   Procedure: BLEPHAROPLASTY REPAIR RESECT;  Surgeon: Karle Starch, MD;  Location: [place];  Service: Ophthalmology;  Laterality: Bilateral;  . CATARACT EXTRACTION W/PHACO Right 10/29/2016   Procedure: CATARACT EXTRACTION PHACO AND INTRAOCULAR LENS PLACEMENT (IOC);  Surgeon: Estill Cotta, MD;  Location: [facility];  Service: Ophthalmology;  Laterality: Right;  [place]  01:04 AP% 24.1 CDE 28.71 fluid pack lot # [id number]  . COLONOSCOPY    . FOOT SURGERY     bunion  . PTOSIS REPAIR Bilateral 03/09/2018   Procedure: PTOSIS REPAIR;  Surgeon: Karle Starch, MD;  Location: [place];  Service: Ophthalmology;  Laterality: Bilateral;    Social History   Socioeconomic History  . Marital status: Married    Spouse name: Not on file  . Number of children: 2  . Years of education: Not on file  .  Highest education level: Not on file  Occupational History  . Not on file  Social Needs  . Financial resource strain: Not hard at all  . Food insecurity    Worry: Never true    Inability: Never true  . Transportation needs    Medical: No    Non-medical: No  Tobacco Use  . Smoking status: Former Smoker    Types: Cigarettes    Quit date: 09/19/1969    Years since quitting: 49.7  . Smokeless tobacco: Never Used  Substance and  Sexual Activity  . Alcohol use: No  . Drug use: No  . Sexual activity: Not Currently  Lifestyle  . Physical activity    Days per week: Patient refused    Minutes per session: Patient refused  . Stress: To some extent  Relationships  . Social connections    Talks on phone: More than three times a week    Gets together: More than three times a week    Attends religious service: More than 4 times per year    Active member of club or organization: Yes    Attends meetings of clubs or organizations: More than 4 times per year    Relationship status: Married  . Intimate partner violence    Fear of current or ex partner: No    Emotionally abused: No    Physically abused: No    Forced sexual activity: No  Other Topics Concern  . Not on file  Social History Narrative  . Not on file     Family History  Problem Relation Age of Onset  . Pancreatic cancer Mother   . Heart attack Maternal Aunt   . Lung cancer Cousin   . Lung cancer Cousin   . Breast cancer Neg Hx      Current Facility-Administered Medications:  .  0.9 %  sodium chloride infusion (Manually program via Guardrails IV Fluids), , Intravenous, Once, Ojie, Jude, MD .  acetaminophen (TYLENOL) tablet 650 mg, 650 mg, Oral, Q6H PRN, 650 mg at 05/30/19 1016 **OR** acetaminophen (TYLENOL) suppository 650 mg, 650 mg, Rectal, Q6H PRN, Lance Coon, MD .  alum & mag hydroxide-simeth (MAALOX/MYLANTA) 200-200-20 MG/5ML suspension 30 mL, 30 mL, Oral, Q6H PRN, Ojie, Jude, MD, 30 mL at 05/30/19 1307 .  atorvastatin (LIPITOR) tablet 20 mg, 20 mg, Oral, Daily, Lance Coon, MD, 20 mg at 05/30/19 1016 .  ceFEPIme (MAXIPIME) 2 g in sodium chloride 0.9 % 100 mL IVPB, 2 g, Intravenous, Q8H, Hallaji, Sheema M, RPH, Last Rate: 200 mL/hr at 05/30/19 1444, 2 g at 05/30/19 1444 .  citalopram (CELEXA) tablet 20 mg, 20 mg, Oral, Daily, Lance Coon, MD, 20 mg at 05/30/19 1016 .  fluconazole (DIFLUCAN) tablet 400 mg, 400 mg, Oral, Daily, Lance Coon, MD, 400 mg at 05/30/19 1016 .  folic acid (FOLVITE) tablet 1 mg, 1 mg, Oral, Daily, Lance Coon, MD, 1 mg at 05/30/19 1016 .  LORazepam (ATIVAN) tablet 1 mg, 1 mg, Oral, QHS PRN, Lance Coon, MD .  metroNIDAZOLE (FLAGYL) IVPB 500 mg, 500 mg, Intravenous, Once, Gregor Hams, MD .  metroNIDAZOLE (FLAGYL) IVPB 500 mg, 500 mg, Intravenous, Q8H, Lance Coon, MD, Last Rate: 100 mL/hr at 05/30/19 1310, 500 mg at 05/30/19 1310 .  ondansetron (ZOFRAN) tablet 4 mg, 4 mg, Oral, Q6H PRN **OR** ondansetron (ZOFRAN) injection 4 mg, 4 mg, Intravenous, Q6H PRN, Lance Coon, MD .  pantoprazole (PROTONIX) EC tablet 40 mg, 40 mg, Oral, Daily, Lance Coon, MD,  40 mg at 05/30/19 1016 .  valACYclovir (VALTREX) tablet 500 mg, 500 mg, Oral, Daily, Lance Coon, MD, 500 mg at 05/30/19 1016 .  [START ON 05/31/2019] vancomycin (VANCOCIN) IVPB 1000 mg/200 mL premix, 1,000 mg, Intravenous, Q24H, Pernell Dupre, [facility]   Physical exam:  Vitals:   05/30/19 0230 05/30/19 0522 05/30/19 1455 05/30/19 1501  BP:  (!) 142/63  126/73  Pulse:  85  76  Resp:  18  18  Temp: 99 F (37.2 C) 100.1 F (37.8 C) 99 F (37.2 C) 98.7 F (37.1 C)  TempSrc: Oral Oral Axillary   SpO2:  100%  98%  Weight:      Height:       Physical Exam HENT:     Head: Normocephalic and atraumatic.  Eyes:     Pupils: Pupils are equal, round, and reactive to light.  Neck:     Musculoskeletal: Normal range of motion.  Cardiovascular:     Rate and Rhythm: Normal rate and regular rhythm.     Heart sounds: Normal heart sounds.  Pulmonary:     Effort: Pulmonary effort is normal.     Breath sounds: Normal breath sounds.  Abdominal:     General: Bowel sounds are normal.     Palpations: Abdomen is soft.  Skin:    General: Skin is warm and dry.  Neurological:     Mental Status: She is alert and oriented to person, place, and time.        CMP Latest Ref Rng & Units 05/30/2019  Glucose 70 - 99 mg/dL 119(H)  BUN 8 - 23  mg/dL 12  Creatinine 0.44 - 1.00 mg/dL 0.60  Sodium 135 - 145 mmol/L 142  Potassium 3.5 - 5.1 mmol/L 3.2(L)  Chloride 98 - 111 mmol/L 106  CO2 22 - 32 mmol/L 28  Calcium 8.9 - 10.3 mg/dL 7.9(L)  Total Protein 6.5 - 8.1 g/dL -  Total Bilirubin 0.3 - 1.2 mg/dL -  Alkaline Phos 38 - 126 U/L -  AST 15 - 41 U/L -  ALT 0 - 44 U/L -   CBC Latest Ref Rng & Units 05/30/2019  WBC 4.0 - 10.5 K/uL 0.2(LL)  Hemoglobin 12.0 - 15.0 g/dL 7.3(L)  Hematocrit 36.0 - 46.0 % 23.2(L)  Platelets 150 - 400 K/uL 12(LL)    '@IMAGES' @  Dg Chest Portable 1 View  Result Date: 05/29/2019 CLINICAL DATA:  Neutropenic fever EXAM: PORTABLE CHEST 1 VIEW COMPARISON:  December 28, 2017 FINDINGS: There is a dual chamber right-sided Port-A-Cath that is well positioned. The heart size is stable. There is no focal infiltrate. No large pleural effusion. No acute osseous abnormality. IMPRESSION: No active disease. Electronically Signed   By: Constance Holster M.D.   On: 05/29/2019 23:56   Ct Bone Marrow Biopsy & Aspiration  Result Date: 05/06/2019 INDICATION: Pancytopenia of uncertain etiology. Please perform CT-guided bone marrow biopsy for tissue diagnostic purposes. EXAM: CT-GUIDED BONE MARROW BIOPSY AND ASPIRATION MEDICATIONS: None ANESTHESIA/SEDATION: Fentanyl 100 mcg IV; Versed 2 mg IV Sedation Time: 15 Minutes; The patient was continuously monitored during the procedure by the interventional radiology nurse under my direct supervision. COMPLICATIONS: None immediate. PROCEDURE: Informed consent was obtained from the patient following an explanation of the procedure, risks, benefits and alternatives. The patient understands, agrees and consents for the procedure. All questions were addressed. A time out was performed prior to the initiation of the procedure. The patient was positioned prone and non-contrast localization CT was performed of the pelvis  to demonstrate the iliac marrow spaces. The operative site was prepped and draped  in the usual sterile fashion. Under sterile conditions and local anesthesia, a 22 gauge spinal needle was utilized for procedural planning. Next, an 11 gauge coaxial bone biopsy needle was advanced into the left iliac marrow space. Needle position was confirmed with CT imaging. Initially, bone marrow aspiration was performed. Next, a bone marrow biopsy was obtained with the 11 gauge outer bone marrow device. The 11 gauge coaxial bone biopsy needle was re-advanced into a slightly different location within the left iliac marrow space, positioning was confirmed and an additional bone marrow biopsy was obtained. Samples were prepared with the cytotechnologist and deemed adequate. The needle was removed intact. Hemostasis was obtained with compression and a dressing was placed. The patient tolerated the procedure well without immediate post procedural complication. IMPRESSION: Successful CT guided left iliac bone marrow aspiration and core biopsy. Electronically Signed   By: Sandi Mariscal M.D.   On: 05/06/2019 09:35    Assessment and plan- Patient is a 73 y.o. female with high-grade B-cell lymphoma status post cycle 1 of dose adjusted [place] chemotherapy given at [facility] admitted for neutropenic fever and pancytopenia  1.  Pancytopenia: She has baseline pancytopenia secondary to bone marrow involvement with high-grade B-cell lymphoma which is further down due to first cycle of chemotherapy.  Her white count is 0.2 today. and her [place] yesterday was 0.  However she has received Neulasta and I would not recommend giving any moreNeupogen at this time. Anemia and thrombocytopenia also secondary to chemotherapy.  Recommend transfusing irradiated blood if H&H less than 7/21 or platelets less than 15 or if there is evidence of clinical bleeding.  2.  Neutropenic fever: Source of sepsis is currently unclear.  Her temperature curve is coming down with empiric antibiotics and she is currently on cefepime, Flagyl and vancomycin.   Blood cultures negative so far.  Chest x-ray and urinalysis were unremarkable.  Recommend continuing IV antibiotics until she is afebrile for 24 -48 hours following which patient can be discharged on oral antibiotics.   Visit Diagnosis 1. Neutropenic fever ([place])   2. Sepsis, due to unspecified organism, unspecified whether acute organ dysfunction present [facility])     Dr. Randa Evens, MD, MPH [facility] [phone] 05/30/2019

## 2019-05-31 LAB — MAGNESIUM: Magnesium: 1.5 mg/dL — ABNORMAL LOW (ref 1.7–2.4)

## 2019-05-31 LAB — BPAM PLATELET PHERESIS
Blood Product Expiration Date: 202008122359
Blood Product Expiration Date: 202008122359
ISSUE DATE / TIME: 202008101545
ISSUE DATE / TIME: 202008102209
Unit Type and Rh: 5100
Unit Type and Rh: 5100

## 2019-05-31 LAB — BASIC METABOLIC PANEL WITH GFR
Anion gap: 10 (ref 5–15)
BUN: 8 mg/dL (ref 8–23)
CO2: 26 mmol/L (ref 22–32)
Calcium: 8.5 mg/dL — ABNORMAL LOW (ref 8.9–10.3)
Chloride: 103 mmol/L (ref 98–111)
Creatinine, Ser: 0.53 mg/dL (ref 0.44–1.00)
GFR calc Af Amer: >60 mL/min
GFR calc non Af Amer: >60 mL/min
Glucose, Bld: 129 mg/dL — ABNORMAL HIGH (ref 70–99)
Potassium: 3.4 mmol/L — ABNORMAL LOW (ref 3.5–5.1)
Sodium: 139 mmol/L (ref 135–145)

## 2019-05-31 LAB — CBC WITH DIFFERENTIAL/PLATELET
Abs Immature Granulocytes: 0 10*3/uL (ref 0.00–0.07)
Basophils Absolute: 0 10*3/uL (ref 0.0–0.1)
Basophils Relative: 0 %
Eosinophils Absolute: 0 10*3/uL (ref 0.0–0.5)
Eosinophils Relative: 4 %
HCT: 22.1 % — ABNORMAL LOW (ref 36.0–46.0)
Hemoglobin: 7 g/dL — ABNORMAL LOW (ref 12.0–15.0)
Immature Granulocytes: 0 %
Lymphocytes Relative: 86 %
Lymphs Abs: 0.2 10*3/uL — ABNORMAL LOW (ref 0.7–4.0)
MCH: 28.5 pg (ref 26.0–34.0)
MCHC: 31.7 g/dL (ref 30.0–36.0)
MCV: 89.8 fL (ref 80.0–100.0)
Monocytes Absolute: 0 10*3/uL — ABNORMAL LOW (ref 0.1–1.0)
Monocytes Relative: 5 %
Neutro Abs: 0 10*3/uL — ABNORMAL LOW (ref 1.7–7.7)
Neutrophils Relative %: 5 %
Platelets: 64 10*3/uL — ABNORMAL LOW (ref 150–400)
RBC: 2.46 MIL/uL — ABNORMAL LOW (ref 3.87–5.11)
RDW: 16 % — ABNORMAL HIGH (ref 11.5–15.5)
Smear Review: NORMAL
WBC: 0.2 10*3/uL — CL (ref 4.0–10.5)
nRBC: 0 % (ref 0.0–0.2)

## 2019-05-31 LAB — URINE CULTURE: Culture: NO GROWTH

## 2019-05-31 LAB — PREPARE PLATELET PHERESIS
Unit division: 0
Unit division: 0

## 2019-05-31 LAB — PREPARE RBC (CROSSMATCH)

## 2019-05-31 MED ORDER — NYSTATIN 100000 UNIT/GM EX CREA
TOPICAL_CREAM | Freq: Two times a day (BID) | CUTANEOUS | Status: DC
  Administered 2019-05-31 – 2019-06-01 (×2): via TOPICAL
  Filled 2019-05-31: qty 15

## 2019-05-31 MED ORDER — SENNOSIDES-DOCUSATE SODIUM 8.6-50 MG PO TABS
1.0000 | ORAL_TABLET | Freq: Two times a day (BID) | ORAL | Status: DC
  Administered 2019-05-31 – 2019-06-02 (×5): 2 via ORAL
  Administered 2019-06-03: 10:00:00 1 via ORAL
  Filled 2019-05-31 (×3): qty 2
  Filled 2019-05-31: qty 1
  Filled 2019-05-31 (×2): qty 2

## 2019-05-31 MED ORDER — MAGNESIUM SULFATE 2 GM/50ML IV SOLN
2.0000 g | Freq: Once | INTRAVENOUS | Status: AC
  Administered 2019-05-31: 2 g via INTRAVENOUS
  Filled 2019-05-31: qty 50

## 2019-05-31 MED ORDER — POTASSIUM CHLORIDE 10 MEQ/100ML IV SOLN
10.0000 meq | Freq: Once | INTRAVENOUS | Status: AC
  Administered 2019-05-31: 10 meq via INTRAVENOUS
  Filled 2019-05-31: qty 100

## 2019-05-31 MED ORDER — SODIUM CHLORIDE 0.9% IV SOLUTION
Freq: Once | INTRAVENOUS | Status: AC
  Administered 2019-05-31: 20:00:00 via INTRAVENOUS

## 2019-05-31 MED ORDER — POTASSIUM CHLORIDE CRYS ER 20 MEQ PO TBCR: 40.0000 meq | EXTENDED_RELEASE_TABLET | Freq: Once | ORAL | Status: DC

## 2019-05-31 NOTE — Progress Notes (Addendum)
[place] [facility] [place] NAME: Mercedes Vasquez    MR#:  [id number]  DATE OF BIRTH:  1946-03-30  SUBJECTIVE:  CHIEF COMPLAINT:   Chief Complaint  Patient presents with  . Fever   No new complaint this morning.  Patient sitting up in bed and having breakfast.  No fevers overnight.  I updated patient's daughter over the phone while in the room with patient this morning.  REVIEW OF SYSTEMS:  Review of Systems  Constitutional: Negative for chills and fever.  HENT: Negative for hearing loss and tinnitus.   Eyes: Negative for blurred vision and double vision.  Respiratory: Negative for cough and shortness of breath.   Cardiovascular: Negative for chest pain and palpitations.  Gastrointestinal: Negative for abdominal pain, diarrhea, heartburn, nausea and vomiting.  Genitourinary: Negative for dysuria and urgency.  Musculoskeletal: Negative for myalgias and neck pain.  Skin: Negative for itching and rash.  Neurological: Negative for dizziness and headaches.  Psychiatric/Behavioral: Negative for depression and hallucinations.    DRUG ALLERGIES:   Allergies  Allergen Reactions  . Ace Inhibitors Swelling    enalapril   VITALS:  Blood pressure 129/75, pulse 83, temperature 99.1 F (37.3 C), temperature source Oral, resp. rate 19, height 5\' 4"  (1.626 m), weight 83.5 kg, SpO2 99 %. PHYSICAL EXAMINATION:  Physical Exam  Constitutional: She is oriented to person, place, and time. She appears well-developed.  HENT:  Head: Normocephalic and atraumatic.  Right Ear: External ear normal.  Eyes: Pupils are equal, round, and reactive to light. Conjunctivae are normal. Right eye exhibits no discharge.  Neck: Normal range of motion. No tracheal deviation present. No thyromegaly present.  Cardiovascular: Normal rate, regular rhythm and normal heart sounds.  Respiratory: Effort normal and breath sounds normal. No respiratory distress.  GI: Soft. She  exhibits no distension. There is no abdominal tenderness.  Musculoskeletal: Normal range of motion.        General: No edema.  Neurological: She is alert and oriented to person, place, and time. No cranial nerve deficit.  Skin: Skin is warm. She is not diaphoretic. No erythema.  Psychiatric: She has a normal mood and affect. Her behavior is normal.    LABORATORY PANEL:  Female CBC Recent Labs  Lab 05/31/19 0501  WBC 0.2*  HGB 7.0*  HCT 22.1*  PLT 64*   ------------------------------------------------------------------------------------------------------------------ Chemistries  Recent Labs  Lab 05/29/19 2209  05/31/19 0501  NA 137   < > 139  K 3.1*   < > 3.4*  CL 96*   < > 103  CO2 28   < > 26  GLUCOSE 160*   < > 129*  BUN 16   < > 8  CREATININE 0.76   < > 0.53  CALCIUM 9.1   < > 8.5*  MG  --   --  1.5*  AST 20  --   --   ALT 18  --   --   ALKPHOS 54  --   --   BILITOT 1.1  --   --    < > = values in this interval not displayed.   RADIOLOGY:  No results found. ASSESSMENT AND PLAN:   1.  Probable sepsis; present on admission but source not yet found. Patient on broad-spectrum IV antibiotics and IV fluids. So far no growth on cultures.  Chest x-ray and urinalysis negative. So far no growth on cultures.  Lactic acid level trended down from 3.9-1.9  2.  Febrile neutropenia Patient presented with fevers with temperature of 101. Patient with history of lymphoma currently on chemotherapy for which she follows up with [facility]. Patient on broad-spectrum IV antibiotics with vancomycin, cefepime and Flagyl pending results of cultures. Seen by oncologist.  Appreciate input.  Recommended continue IV antibiotics for another 24 hours and patient can be discharged on p.o. antibiotics if patient remains afebrile and no growth on cultures. Monitor clinically  3.  Pancytopenia Platelet count improved from 12,000-64,000 following 2 units of platelet transfusion.  Hemoglobin stable at  7.0.  WBC count of 0.2 with ANC of 0.  Appreciate oncologist input.  Patient has received Neulasta.  Oncologist does not recommend giving any more Neupogen at this time.  Oncologist Dr. Janese Banks recommended transfusing 1 unit of irradiated packed red blood cells today.  Follow-up CBC in a.m.  4.  Recent diagnosis of lymphoma Patient on chemotherapy at [facility]. Follow-up on oncology input  5.  Hypertension -blood pressure fairly controlled.  To resume home blood pressure meds if blood pressure begins to trend up    6.GERD (gastroesophageal reflux disease) -home dose PPI  GI prophylaxis; SCDs No heparin products due to thrombocytopenia  All the records are reviewed and case discussed with Care Management/Social Worker. Management plans discussed with the patient, and she is in agreement. I updated patient's daughter Ms. Lattie Haw over the phone on treatment plans this morning.  CODE STATUS: Full Code  TOTAL TIME TAKING CARE OF THIS PATIENT: 35 minutes.   More than 50% of the time was spent in counseling/coordination of care: YES  POSSIBLE D/C IN 1-2 DAYS, DEPENDING ON CLINICAL CONDITION.   Dalya Maselli M.D on 05/31/2019 at 12:01 PM  Between 7am to 6pm - Pager - [phone]  After 6pm go to [email or web address] - Proofreader  [employer] [facility] [facility]  Office  [phone]  CC: Primary care physician; Perrin Maltese, MD  Note: This dictation was prepared with Dragon dictation along with smaller phrase technology. Any transcriptional errors that result from this process are unintentional.

## 2019-05-31 NOTE — Progress Notes (Addendum)
Hematology/Oncology Consult note [facility]  [phone] [phone]  Patient Care Team: Perrin Maltese, MD as PCP - General (Internal Medicine)   Name of the patient: Mercedes Vasquez  [id number]  14-May-1946   Date of visit:05/31/2019    Interval history-she has remained afebrile   Review of systems- Review of Systems  Constitutional: Positive for malaise/fatigue. Negative for chills, fever and weight loss.  HENT: Negative for congestion, ear discharge and nosebleeds.   Eyes: Negative for blurred vision.  Respiratory: Negative for cough, hemoptysis, sputum production, shortness of breath and wheezing.   Cardiovascular: Negative for chest pain, palpitations, orthopnea and claudication.  Gastrointestinal: Negative for abdominal pain, blood in stool, constipation, diarrhea, heartburn, melena, nausea and vomiting.  Genitourinary: Negative for dysuria, flank pain, frequency, hematuria and urgency.  Musculoskeletal: Negative for back pain, joint pain and myalgias.  Skin: Negative for rash.  Neurological: Negative for dizziness, tingling, focal weakness, seizures, weakness and headaches.  Endo/Heme/Allergies: Does not bruise/bleed easily.  Psychiatric/Behavioral: Negative for depression and suicidal ideas. The patient does not have insomnia.        Allergies  Allergen Reactions   Ace Inhibitors Swelling    enalapril     Past Medical History:  Diagnosis Date   Anemia    Anxiety    GERD (gastroesophageal reflux disease)    Heart murmur    Hypercholesteremia    Hypertension    controlled on meds   Lymphoma ([place]) 05/30/2019   Osteoporosis    Pancytopenia ([place])    Wears dentures    lower     Past Surgical History:  Procedure Laterality Date   ABDOMINAL HYSTERECTOMY     BROW LIFT Bilateral 03/09/2018   Procedure: BLEPHAROPLASTY REPAIR RESECT;  Surgeon: Karle Starch, MD;  Location: [facility];  Service:  Ophthalmology;  Laterality: Bilateral;   CATARACT EXTRACTION W/PHACO Right 10/29/2016   Procedure: CATARACT EXTRACTION PHACO AND INTRAOCULAR LENS PLACEMENT (IOC);  Surgeon: Estill Cotta, MD;  Location: [facility];  Service: Ophthalmology;  Laterality: Right;  [place]  01:04 AP% 24.1 CDE 28.71 fluid pack lot # [id number]   COLONOSCOPY     FOOT SURGERY     bunion   PTOSIS REPAIR Bilateral 03/09/2018   Procedure: PTOSIS REPAIR;  Surgeon: Karle Starch, MD;  Location: [facility];  Service: Ophthalmology;  Laterality: Bilateral;    Social History   Socioeconomic History   Marital status: Married    Spouse name: Not on file   Number of children: 2   Years of education: Not on file   Highest education level: Not on file  Occupational History   Not on file  Social Needs   Financial resource strain: Not hard at all   Food insecurity    Worry: Never true    Inability: Never true   Transportation needs    Medical: No    Non-medical: No  Tobacco Use   Smoking status: Former Smoker    Types: Cigarettes    Quit date: 09/19/1969    Years since quitting: 49.7   Smokeless tobacco: Never Used  Substance and Sexual Activity   Alcohol use: No   Drug use: No   Sexual activity: Not Currently  Lifestyle   Physical activity    Days per week: Patient refused    Minutes per session: Patient refused   Stress: To some extent  Relationships   Social connections    Talks on phone: More than three  times a week    Gets together: More than three times a week    Attends religious service: More than 4 times per year    Active member of club or organization: Yes    Attends meetings of clubs or organizations: More than 4 times per year    Relationship status: Married   Intimate partner violence    Fear of current or ex partner: No    Emotionally abused: No    Physically abused: No    Forced sexual activity: No  Other Topics Concern   Not on file  Social History  Narrative   Not on file    Family History  Problem Relation Age of Onset   Pancreatic cancer Mother    Heart attack Maternal Aunt    Lung cancer Cousin    Lung cancer Cousin    Breast cancer Neg Hx      Current Facility-Administered Medications:    0.9 %  sodium chloride infusion (Manually program via Guardrails IV Fluids), , Intravenous, Once, Ojie, Jude, MD   0.9 %  sodium chloride infusion, , Intravenous, PRN, Stark Jock, Jude, MD, Stopped at 05/31/19 0057   0.9 %  sodium chloride infusion, , Intravenous, PRN, Stark Jock, Jude, MD, Last Rate: 10 mL/hr at 05/31/19 0602, 250 mL at 05/31/19 0602   acetaminophen (TYLENOL) tablet 650 mg, 650 mg, Oral, Q6H PRN, 650 mg at 05/31/19 0646 **OR** acetaminophen (TYLENOL) suppository 650 mg, 650 mg, Rectal, Q6H PRN, Lance Coon, MD   alum & mag hydroxide-simeth (MAALOX/MYLANTA) 200-200-20 MG/5ML suspension 30 mL, 30 mL, Oral, Q6H PRN, Ojie, Jude, MD, 30 mL at 05/30/19 1307   atorvastatin (LIPITOR) tablet 20 mg, 20 mg, Oral, Daily, Lance Coon, MD, 20 mg at 05/31/19 1034   ceFEPIme (MAXIPIME) 2 g in sodium chloride 0.9 % 100 mL IVPB, 2 g, Intravenous, Q8H, Hallaji, Sheema M, RPH, Last Rate: 200 mL/hr at 05/31/19 0603, 2 g at 05/31/19 0603   citalopram (CELEXA) tablet 20 mg, 20 mg, Oral, Daily, Lance Coon, MD, 20 mg at 05/31/19 1034   fluconazole (DIFLUCAN) tablet 400 mg, 400 mg, Oral, Daily, Lance Coon, MD, 400 mg at 95/32/02 3343   folic acid (FOLVITE) tablet 1 mg, 1 mg, Oral, Daily, Lance Coon, MD, 1 mg at 05/30/19 1016   LORazepam (ATIVAN) tablet 1 mg, 1 mg, Oral, QHS PRN, Lance Coon, MD   metroNIDAZOLE (FLAGYL) IVPB 500 mg, 500 mg, Intravenous, Once, Gregor Hams, MD   metroNIDAZOLE (FLAGYL) IVPB 500 mg, 500 mg, Intravenous, Q8H, Lance Coon, MD, Last Rate: 100 mL/hr at 05/31/19 0648, 500 mg at 05/31/19 0648   ondansetron (ZOFRAN) tablet 4 mg, 4 mg, Oral, Q6H PRN **OR** ondansetron (ZOFRAN) injection 4 mg, 4 mg,  Intravenous, Q6H PRN, Lance Coon, MD   pantoprazole (PROTONIX) EC tablet 40 mg, 40 mg, Oral, Daily, Lance Coon, MD, 40 mg at 05/31/19 1033   potassium chloride 10 mEq in 100 mL IVPB, 10 mEq, Intravenous, Once, Ojie, Jude, MD   valACYclovir (VALTREX) tablet 500 mg, 500 mg, Oral, Daily, Lance Coon, MD, 500 mg at 05/31/19 1034   vancomycin (VANCOCIN) IVPB 1000 mg/200 mL premix, 1,000 mg, Intravenous, Q24H, Pernell Dupre, RPH, Stopped at 05/31/19 0112  Physical exam:  Vitals:   05/30/19 2235 05/31/19 0059 05/31/19 0100 05/31/19 0343  BP: (!) 144/74 140/83  129/75  Pulse: 79 80  83  Resp:  20  19  Temp: 99.1 F (37.3 C)  99.1 F (37.3 C) 99.1 F (37.3  C)  TempSrc:   Oral Oral  SpO2:    99%  Weight:      Height:       Physical Exam Constitutional:      General: She is not in acute distress. HENT:     Head: Normocephalic and atraumatic.     Mouth/Throat:     Mouth: Mucous membranes are moist.     Pharynx: Oropharynx is clear.  Eyes:     Pupils: Pupils are equal, round, and reactive to light.  Neck:     Musculoskeletal: Normal range of motion.  Cardiovascular:     Rate and Rhythm: Normal rate and regular rhythm.     Heart sounds: Normal heart sounds.  Pulmonary:     Effort: Pulmonary effort is normal.     Breath sounds: Normal breath sounds.  Abdominal:     General: Bowel sounds are normal.     Palpations: Abdomen is soft.  Skin:    General: Skin is warm and dry.  Neurological:     Mental Status: She is alert and oriented to person, place, and time.      CMP Latest Ref Rng & Units 05/31/2019  Glucose 70 - 99 mg/dL 129(H)  BUN 8 - 23 mg/dL 8  Creatinine 0.44 - 1.00 mg/dL 0.53  Sodium 135 - 145 mmol/L 139  Potassium 3.5 - 5.1 mmol/L 3.4(L)  Chloride 98 - 111 mmol/L 103  CO2 22 - 32 mmol/L 26  Calcium 8.9 - 10.3 mg/dL 8.5(L)  Total Protein 6.5 - 8.1 g/dL -  Total Bilirubin 0.3 - 1.2 mg/dL -  Alkaline Phos 38 - 126 U/L -  AST 15 - 41 U/L -  ALT 0 - 44  U/L -   CBC Latest Ref Rng & Units 05/31/2019  WBC 4.0 - 10.5 K/uL 0.2(LL)  Hemoglobin 12.0 - 15.0 g/dL 7.0(L)  Hematocrit 36.0 - 46.0 % 22.1(L)  Platelets 150 - 400 K/uL 64(L)    _0 @  Dg Chest Portable 1 View  Result Date: 05/29/2019 CLINICAL DATA:  Neutropenic fever EXAM: PORTABLE CHEST 1 VIEW COMPARISON:  December 28, 2017 FINDINGS: There is a dual chamber right-sided Port-A-Cath that is well positioned. The heart size is stable. There is no focal infiltrate. No large pleural effusion. No acute osseous abnormality. IMPRESSION: No active disease. Electronically Signed   By: Constance Holster M.D.   On: 05/29/2019 23:56   Ct Bone Marrow Biopsy & Aspiration  Result Date: 05/06/2019 INDICATION: Pancytopenia of uncertain etiology. Please perform CT-guided bone marrow biopsy for tissue diagnostic purposes. EXAM: CT-GUIDED BONE MARROW BIOPSY AND ASPIRATION MEDICATIONS: None ANESTHESIA/SEDATION: Fentanyl 100 mcg IV; Versed 2 mg IV Sedation Time: 15 Minutes; The patient was continuously monitored during the procedure by the interventional radiology nurse under my direct supervision. COMPLICATIONS: None immediate. PROCEDURE: Informed consent was obtained from the patient following an explanation of the procedure, risks, benefits and alternatives. The patient understands, agrees and consents for the procedure. All questions were addressed. A time out was performed prior to the initiation of the procedure. The patient was positioned prone and non-contrast localization CT was performed of the pelvis to demonstrate the iliac marrow spaces. The operative site was prepped and draped in the usual sterile fashion. Under sterile conditions and local anesthesia, a 22 gauge spinal needle was utilized for procedural planning. Next, an 11 gauge coaxial bone biopsy needle was advanced into the left iliac marrow space. Needle position was confirmed with CT imaging. Initially, bone marrow aspiration was performed.  Next,  a bone marrow biopsy was obtained with the 11 gauge outer bone marrow device. The 11 gauge coaxial bone biopsy needle was re-advanced into a slightly different location within the left iliac marrow space, positioning was confirmed and an additional bone marrow biopsy was obtained. Samples were prepared with the cytotechnologist and deemed adequate. The needle was removed intact. Hemostasis was obtained with compression and a dressing was placed. The patient tolerated the procedure well without immediate post procedural complication. IMPRESSION: Successful CT guided left iliac bone marrow aspiration and core biopsy. Electronically Signed   By: Sandi Mariscal M.D.   On: 05/06/2019 09:35     Assessment and plan- Patient is a 73 y.o. 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 No clear source so far. Cultures negative. I discussed her case with DR. Raylene Everts at [facility] where she received her chemo. Dr. Thera Flake recommends keeping patient inpatient until her wbc shows signs of recovery. She has already received neulasta. No need to give neupogen. Continue IV antibiotics while inpatient.  Pancytopenia- please transfuse 1 unit prbc for hb of 7 today. Platelets have improved after transfusion yesterday. Please transfuse irradiated products   Visit Diagnosis 1. Neutropenic fever ([place])   2. Sepsis, due to unspecified organism, unspecified whether acute organ dysfunction present [facility])      Dr. Randa Evens, MD, MPH [facility] at [facility] [phone] 05/31/2019  4:31 PM

## 2019-06-01 LAB — CBC WITH DIFFERENTIAL/PLATELET
Abs Immature Granulocytes: 0 10*3/uL (ref 0.00–0.07)
Basophils Absolute: 0 10*3/uL (ref 0.0–0.1)
Basophils Relative: 0 %
Eosinophils Absolute: 0 10*3/uL (ref 0.0–0.5)
Eosinophils Relative: 4 %
HCT: 25 % — ABNORMAL LOW (ref 36.0–46.0)
Hemoglobin: 8 g/dL — ABNORMAL LOW (ref 12.0–15.0)
Immature Granulocytes: 0 %
Lymphocytes Relative: 79 %
Lymphs Abs: 0.2 10*3/uL — ABNORMAL LOW (ref 0.7–4.0)
MCH: 27.9 pg (ref 26.0–34.0)
MCHC: 32 g/dL (ref 30.0–36.0)
MCV: 87.1 fL (ref 80.0–100.0)
Monocytes Absolute: 0 10*3/uL — ABNORMAL LOW (ref 0.1–1.0)
Monocytes Relative: 4 %
Neutro Abs: 0 10*3/uL — ABNORMAL LOW (ref 1.7–7.7)
Neutrophils Relative %: 13 %
Platelets: 29 10*3/uL — CL (ref 150–400)
RBC: 2.87 MIL/uL — ABNORMAL LOW (ref 3.87–5.11)
RDW: 16.8 % — ABNORMAL HIGH (ref 11.5–15.5)
WBC: 0.2 10*3/uL — CL (ref 4.0–10.5)
nRBC: 0 % (ref 0.0–0.2)

## 2019-06-01 LAB — BASIC METABOLIC PANEL
Anion gap: 6 (ref 5–15)
BUN: 8 mg/dL (ref 8–23)
CO2: 25 mmol/L (ref 22–32)
Calcium: 8.2 mg/dL — ABNORMAL LOW (ref 8.9–10.3)
Chloride: 106 mmol/L (ref 98–111)
Creatinine, Ser: 0.49 mg/dL (ref 0.44–1.00)
GFR calc Af Amer: >60 mL/min (ref >60)
GFR calc non Af Amer: >60 mL/min (ref >60)
Glucose, Bld: 123 mg/dL — ABNORMAL HIGH (ref 70–99)
Potassium: 3.5 mmol/L (ref 3.5–5.1)
Sodium: 137 mmol/L (ref 135–145)

## 2019-06-01 LAB — TYPE AND SCREEN
ABO/RH(D): O NEG
Antibody Screen: NEGATIVE
Unit division: 0

## 2019-06-01 LAB — BPAM RBC
Blood Product Expiration Date: 202008192359
ISSUE DATE / TIME: 202008112016
Unit Type and Rh: 9500

## 2019-06-01 LAB — MAGNESIUM: Magnesium: 1.9 mg/dL (ref 1.7–2.4)

## 2019-06-01 MED ORDER — BISACODYL 10 MG RE SUPP
10.0000 mg | Freq: Every day | RECTAL | Status: DC
  Administered 2019-06-01 – 2019-06-03 (×3): 10 mg via RECTAL
  Filled 2019-06-01 (×3): qty 1

## 2019-06-01 MED ORDER — LEVOFLOXACIN 750 MG PO TABS
750.0000 mg | ORAL_TABLET | Freq: Every day | ORAL | Status: DC
  Administered 2019-06-01 – 2019-06-03 (×3): 750 mg via ORAL
  Filled 2019-06-01 (×3): qty 1

## 2019-06-01 MED ORDER — DEXTROSE 50 % IV SOLN
INTRAVENOUS | Status: AC
  Filled 2019-06-01: qty 50

## 2019-06-01 MED ORDER — ALLOPURINOL 100 MG PO TABS
300.0000 mg | ORAL_TABLET | Freq: Every day | ORAL | Status: DC
  Administered 2019-06-01 – 2019-06-03 (×3): 300 mg via ORAL
  Filled 2019-06-01 (×3): qty 3

## 2019-06-01 MED ORDER — POLYETHYLENE GLYCOL 3350 17 G PO PACK
17.0000 g | PACK | Freq: Every day | ORAL | Status: DC
  Administered 2019-06-01 – 2019-06-03 (×3): 17 g via ORAL
  Filled 2019-06-01 (×3): qty 1

## 2019-06-01 MED ORDER — OMEGA-3-ACID ETHYL ESTERS 1 G PO CAPS
1.0000 g | ORAL_CAPSULE | Freq: Every day | ORAL | Status: DC
  Filled 2019-06-01: qty 1

## 2019-06-01 MED ORDER — NYSTATIN 100000 UNIT/GM EX POWD
Freq: Three times a day (TID) | CUTANEOUS | Status: DC
  Administered 2019-06-01 – 2019-06-02 (×6): via TOPICAL
  Filled 2019-06-01: qty 15

## 2019-06-01 MED ORDER — FLUTICASONE PROPIONATE 50 MCG/ACT NA SUSP
2.0000 | Freq: Every day | NASAL | Status: DC
  Filled 2019-06-01: qty 16

## 2019-06-01 MED ORDER — DOCUSATE SODIUM 100 MG PO CAPS
200.0000 mg | ORAL_CAPSULE | Freq: Two times a day (BID) | ORAL | Status: DC
  Administered 2019-06-01 – 2019-06-03 (×5): 200 mg via ORAL
  Filled 2019-06-01 (×5): qty 2

## 2019-06-01 MED ORDER — GABAPENTIN 100 MG PO CAPS
100.0000 mg | ORAL_CAPSULE | Freq: Two times a day (BID) | ORAL | Status: DC
  Administered 2019-06-01 – 2019-06-03 (×5): 100 mg via ORAL
  Filled 2019-06-01 (×5): qty 1

## 2019-06-01 NOTE — Progress Notes (Signed)
[place] [facility] [place] NAME: Mercedes Vasquez    MR#:  [id number]  DATE OF BIRTH:  1945-10-29  SUBJECTIVE:  CHIEF COMPLAINT:   Chief Complaint  Patient presents with  . Fever   Patient complains of constipation   REVIEW OF SYSTEMS:  Review of Systems  Constitutional: Negative for chills and fever.  HENT: Negative for hearing loss and tinnitus.   Eyes: Negative for blurred vision and double vision.  Respiratory: Negative for cough and shortness of breath.   Cardiovascular: Negative for chest pain and palpitations.  Gastrointestinal: Positive for abdominal pain. Negative for diarrhea, heartburn, nausea and vomiting.  Genitourinary: Negative for dysuria and urgency.  Musculoskeletal: Negative for myalgias and neck pain.  Skin: Negative for itching and rash.  Neurological: Negative for dizziness and headaches.  Psychiatric/Behavioral: Negative for depression and hallucinations.    DRUG ALLERGIES:   Allergies  Allergen Reactions  . Ace Inhibitors Swelling    enalapril   VITALS:  Blood pressure (!) 146/73, pulse 68, temperature 99.5 F (37.5 C), temperature source Oral, resp. rate 18, height 5\' 4"  (1.626 m), weight 83.5 kg, SpO2 100 %. PHYSICAL EXAMINATION:  Physical Exam  Constitutional: She is oriented to person, place, and time. She appears well-developed.  HENT:  Head: Normocephalic and atraumatic.  Right Ear: External ear normal.  Eyes: Pupils are equal, round, and reactive to light. Conjunctivae are normal. Right eye exhibits no discharge.  Neck: Normal range of motion. No tracheal deviation present. No thyromegaly present.  Cardiovascular: Normal rate, regular rhythm and normal heart sounds.  Respiratory: Effort normal and breath sounds normal. No respiratory distress.  GI: Soft. She exhibits no distension. There is no abdominal tenderness.  Musculoskeletal: Normal range of motion.        General: No edema.  Neurological:  She is alert and oriented to person, place, and time. No cranial nerve deficit.  Skin: Skin is warm. She is not diaphoretic. No erythema.  Psychiatric: She has a normal mood and affect. Her behavior is normal.    LABORATORY PANEL:  Female CBC Recent Labs  Lab 06/01/19 0531  WBC 0.2*  HGB 8.0*  HCT 25.0*  PLT 29*   ------------------------------------------------------------------------------------------------------------------ Chemistries  Recent Labs  Lab 05/29/19 2209  06/01/19 0531  NA 137   < > 137  K 3.1*   < > 3.5  CL 96*   < > 106  CO2 28   < > 25  GLUCOSE 160*   < > 123*  BUN 16   < > 8  CREATININE 0.76   < > 0.49  CALCIUM 9.1   < > 8.2*  MG  --    < > 1.9  AST 20  --   --   ALT 18  --   --   ALKPHOS 54  --   --   BILITOT 1.1  --   --    < > = values in this interval not displayed.   RADIOLOGY:  No results found. ASSESSMENT AND PLAN:   1.  Probable sepsis; present on admission but source not yet found. Change oral Levaquin So far no growth on cultures.  Chest x-ray and urinalysis negative. So far no growth on cultures.  Lactic acid level trended down from 3.9-1.9  2.  Febrile neutropenia Patient presented with fevers with temperature of 101. Patient with history of lymphoma currently on chemotherapy for which she follows up with [facility]. Patient treated with broad-spectrum  IV antibiotics with vancomycin, cefepime and Flagyl all cultures are negative change to oral antibiotic Seen by oncologist.  Appreciate input.     3.  Pancytopenia WBC count stable since yesterday, hemoglobin stable platelet did drop hematology is following the patient  4.  Recent diagnosis of lymphoma Patient on chemotherapy at [facility]. Follow-up on oncology input  5.  Hypertension -blood pressure fairly controlled.     6.GERD (gastroesophageal reflux disease) -home dose PPI  GI prophylaxis; SCDs No heparin products due to thrombocytopenia  All the records are reviewed and case  discussed with Care Management/Social Worker. Management plans discussed with the patient, and she is in agreement. I updated patient's daughter Ms. Lattie Haw over the phone on treatment plans this morning.  CODE STATUS: Full Code  TOTAL TIME TAKING CARE OF THIS PATIENT: 35 minutes.   More than 50% of the time was spent in counseling/coordination of care: YES  POSSIBLE D/C IN 1-2 DAYS, DEPENDING ON CLINICAL CONDITION.   Dustin Flock M.D on 06/01/2019 at 1:39 PM  Between 7am to 6pm - Pager - [phone]  After 6pm go to [email or web address] - Proofreader  [facility]  Office  [phone]  CC: Primary care physician; Perrin Maltese, MD  Note: This dictation was prepared with Dragon dictation along with smaller phrase technology. Any transcriptional errors that result from this process are unintentional.

## 2019-06-02 LAB — CBC WITH DIFFERENTIAL/PLATELET
Abs Immature Granulocytes: 0 10*3/uL (ref 0.00–0.07)
Basophils Absolute: 0 10*3/uL (ref 0.0–0.1)
Basophils Relative: 0 %
Eosinophils Absolute: 0.1 10*3/uL (ref 0.0–0.5)
Eosinophils Relative: 16 %
HCT: 25.4 % — ABNORMAL LOW (ref 36.0–46.0)
Hemoglobin: 8.2 g/dL — ABNORMAL LOW (ref 12.0–15.0)
Immature Granulocytes: 0 %
Lymphocytes Relative: 59 %
Lymphs Abs: 0.3 10*3/uL — ABNORMAL LOW (ref 0.7–4.0)
MCH: 28 pg (ref 26.0–34.0)
MCHC: 32.3 g/dL (ref 30.0–36.0)
MCV: 86.7 fL (ref 80.0–100.0)
Monocytes Absolute: 0 10*3/uL — ABNORMAL LOW (ref 0.1–1.0)
Monocytes Relative: 2 %
Neutro Abs: 0.1 10*3/uL — ABNORMAL LOW (ref 1.7–7.7)
Neutrophils Relative %: 23 %
Platelets: 32 10*3/uL — ABNORMAL LOW (ref 150–400)
RBC: 2.93 MIL/uL — ABNORMAL LOW (ref 3.87–5.11)
RDW: 17.2 % — ABNORMAL HIGH (ref 11.5–15.5)
WBC: 0.4 10*3/uL — CL (ref 4.0–10.5)

## 2019-06-02 NOTE — Progress Notes (Signed)
Hematology/Oncology Consult note [facility]  [phone] [phone]  Patient Care Team: Perrin Maltese, MD as PCP - General (Internal Medicine)   Name of the patient: Mercedes Vasquez  [id number]  03/04/1946   Date of visit: 06/02/19   Interval history-overall she feels well and denies any complaints at this time.  She has remained afebrile now for more than 72 hours  ECOG PS- 1 Pain scale- 0   Review of systems- Review of Systems  Constitutional: Negative for chills, fever, malaise/fatigue and weight loss.  HENT: Negative for congestion, ear discharge and nosebleeds.   Eyes: Negative for blurred vision.  Respiratory: Negative for cough, hemoptysis, sputum production, shortness of breath and wheezing.   Cardiovascular: Negative for chest pain, palpitations, orthopnea and claudication.  Gastrointestinal: Negative for abdominal pain, blood in stool, constipation, diarrhea, heartburn, melena, nausea and vomiting.  Genitourinary: Negative for dysuria, flank pain, frequency, hematuria and urgency.  Musculoskeletal: Negative for back pain, joint pain and myalgias.  Skin: Negative for rash.  Neurological: Negative for dizziness, tingling, focal weakness, seizures, weakness and headaches.  Endo/Heme/Allergies: Does not bruise/bleed easily.  Psychiatric/Behavioral: Negative for depression and suicidal ideas. The patient does not have insomnia.       Allergies  Allergen Reactions  . Ace Inhibitors Swelling    enalapril     Past Medical History:  Diagnosis Date  . Anemia   . Anxiety   . GERD (gastroesophageal reflux disease)   . Heart murmur   . Hypercholesteremia   . Hypertension    controlled on meds  . Lymphoma ([place]) 05/30/2019  . Osteoporosis   . Pancytopenia ([place])   . Wears dentures    lower     Past Surgical History:  Procedure Laterality Date  . ABDOMINAL HYSTERECTOMY    . BROW LIFT Bilateral 03/09/2018   Procedure:  BLEPHAROPLASTY REPAIR RESECT;  Surgeon: Karle Starch, MD;  Location: [place];  Service: Ophthalmology;  Laterality: Bilateral;  . CATARACT EXTRACTION W/PHACO Right 10/29/2016   Procedure: CATARACT EXTRACTION PHACO AND INTRAOCULAR LENS PLACEMENT (IOC);  Surgeon: Estill Cotta, MD;  Location: [facility];  Service: Ophthalmology;  Laterality: Right;  [place]  01:04 AP% 24.1 CDE 28.71 fluid pack lot # [id number]  . COLONOSCOPY    . FOOT SURGERY     bunion  . PTOSIS REPAIR Bilateral 03/09/2018   Procedure: PTOSIS REPAIR;  Surgeon: Karle Starch, MD;  Location: [place];  Service: Ophthalmology;  Laterality: Bilateral;    Social History   Socioeconomic History  . Marital status: Married    Spouse name: Not on file  . Number of children: 2  . Years of education: Not on file  . Highest education level: Not on file  Occupational History  . Not on file  Social Needs  . Financial resource strain: Not hard at all  . Food insecurity    Worry: Never true    Inability: Never true  . Transportation needs    Medical: No    Non-medical: No  Tobacco Use  . Smoking status: Former Smoker    Types: Cigarettes    Quit date: 09/19/1969    Years since quitting: 49.7  . Smokeless tobacco: Never Used  Substance and Sexual Activity  . Alcohol use: No  . Drug use: No  . Sexual activity: Not Currently  Lifestyle  . Physical activity    Days per week: Patient refused    Minutes per session: Patient refused  .  Stress: To some extent  Relationships  . Social connections    Talks on phone: More than three times a week    Gets together: More than three times a week    Attends religious service: More than 4 times per year    Active member of club or organization: Yes    Attends meetings of clubs or organizations: More than 4 times per year    Relationship status: Married  . Intimate partner violence    Fear of current or ex partner: No    Emotionally abused: No    Physically  abused: No    Forced sexual activity: No  Other Topics Concern  . Not on file  Social History Narrative  . Not on file    Family History  Problem Relation Age of Onset  . Pancreatic cancer Mother   . Heart attack Maternal Aunt   . Lung cancer Cousin   . Lung cancer Cousin   . Breast cancer Neg Hx      Current Facility-Administered Medications:  .  0.9 %  sodium chloride infusion, , Intravenous, PRN, Stark Jock, Jude, MD, Stopped at 06/01/19 0036 .  0.9 %  sodium chloride infusion, , Intravenous, PRN, Stark Jock, Jude, MD, Stopped at 06/01/19 0920 .  acetaminophen (TYLENOL) tablet 650 mg, 650 mg, Oral, Q6H PRN, 650 mg at 05/31/19 0646 **OR** acetaminophen (TYLENOL) suppository 650 mg, 650 mg, Rectal, Q6H PRN, Lance Coon, MD .  allopurinol (ZYLOPRIM) tablet 300 mg, 300 mg, Oral, Daily, Dustin Flock, MD, 300 mg at 06/02/19 0902 .  alum & mag hydroxide-simeth (MAALOX/MYLANTA) 200-200-20 MG/5ML suspension 30 mL, 30 mL, Oral, Q6H PRN, [place], Jude, MD, 30 mL at 05/30/19 1307 .  atorvastatin (LIPITOR) tablet 20 mg, 20 mg, Oral, Daily, Lance Coon, MD, 20 mg at 06/02/19 0902 .  bisacodyl (DULCOLAX) suppository 10 mg, 10 mg, Rectal, Daily, Dustin Flock, MD, 10 mg at 06/01/19 1730 .  citalopram (CELEXA) tablet 20 mg, 20 mg, Oral, Daily, Lance Coon, MD, 20 mg at 06/02/19 0902 .  docusate sodium (COLACE) capsule 200 mg, 200 mg, Oral, BID, Dustin Flock, MD, 200 mg at 06/02/19 0902 .  fluconazole (DIFLUCAN) tablet 400 mg, 400 mg, Oral, Daily, Lance Coon, MD, 400 mg at 06/02/19 0902 .  fluticasone (FLONASE) 50 MCG/ACT nasal spray 2 spray, 2 spray, Each Nare, Daily, Dustin Flock, MD .  folic acid (FOLVITE) tablet 1 mg, 1 mg, Oral, Daily, Lance Coon, MD, 1 mg at 06/02/19 0902 .  gabapentin (NEURONTIN) capsule 100 mg, 100 mg, Oral, BID, Dustin Flock, MD, 100 mg at 06/02/19 0902 .  levofloxacin (LEVAQUIN) tablet 750 mg, 750 mg, Oral, Daily, Dustin Flock, MD, 750 mg at 06/02/19 0902 .   LORazepam (ATIVAN) tablet 1 mg, 1 mg, Oral, QHS PRN, Lance Coon, MD .  metroNIDAZOLE (FLAGYL) IVPB 500 mg, 500 mg, Intravenous, Once, Gregor Hams, MD .  nystatin (MYCOSTATIN/NYSTOP) topical powder, , Topical, TID, Dustin Flock, MD .  omega-3 acid ethyl esters (LOVAZA) capsule 1 g, 1 g, Oral, Daily, Dustin Flock, MD .  ondansetron (ZOFRAN) tablet 4 mg, 4 mg, Oral, Q6H PRN **OR** ondansetron (ZOFRAN) injection 4 mg, 4 mg, Intravenous, Q6H PRN, Lance Coon, MD .  pantoprazole (PROTONIX) EC tablet 40 mg, 40 mg, Oral, Daily, Lance Coon, MD, 40 mg at 06/02/19 0902 .  polyethylene glycol (MIRALAX / GLYCOLAX) packet 17 g, 17 g, Oral, Daily, Dustin Flock, MD, 17 g at 06/02/19 0901 .  senna-docusate (Senokot-S) tablet 1-2 tablet, 1-2 tablet,  Oral, BID, Lance Coon, MD, 2 tablet at 06/02/19 5391 .  valACYclovir (VALTREX) tablet 500 mg, 500 mg, Oral, Daily, Lance Coon, MD, 500 mg at 06/02/19 0902  Physical exam:  Vitals:   06/01/19 0437 06/01/19 1558 06/01/19 2021 06/02/19 0442  BP: (!) 146/73 129/71 137/74 138/69  Pulse: 68 79 75 72  Resp: '18 18 16 16  ' Temp: 99.5 F (37.5 C) 98.7 F (37.1 C) 98.2 F (36.8 C) 99.2 F (37.3 C)  TempSrc: Oral Oral    SpO2: 100% 100% 100% 100%  Weight:      Height:       Physical Exam HENT:     Head: Normocephalic and atraumatic.     Mouth/Throat:     Mouth: Mucous membranes are moist.     Pharynx: Oropharynx is clear.  Eyes:     Pupils: Pupils are equal, round, and reactive to light.  Neck:     Musculoskeletal: Normal range of motion.  Cardiovascular:     Rate and Rhythm: Normal rate and regular rhythm.     Heart sounds: Normal heart sounds.  Pulmonary:     Effort: Pulmonary effort is normal.     Breath sounds: Normal breath sounds.  Abdominal:     General: Bowel sounds are normal.     Palpations: Abdomen is soft.  Skin:    General: Skin is warm and dry.  Neurological:     Mental Status: She is alert and oriented to  person, place, and time.      CMP Latest Ref Rng & Units 06/01/2019  Glucose 70 - 99 mg/dL 123(H)  BUN 8 - 23 mg/dL 8  Creatinine 0.44 - 1.00 mg/dL 0.49  Sodium 135 - 145 mmol/L 137  Potassium 3.5 - 5.1 mmol/L 3.5  Chloride 98 - 111 mmol/L 106  CO2 22 - 32 mmol/L 25  Calcium 8.9 - 10.3 mg/dL 8.2(L)  Total Protein 6.5 - 8.1 g/dL -  Total Bilirubin 0.3 - 1.2 mg/dL -  Alkaline Phos 38 - 126 U/L -  AST 15 - 41 U/L -  ALT 0 - 44 U/L -   CBC Latest Ref Rng & Units 06/01/2019  WBC 4.0 - 10.5 K/uL 0.2(LL)  Hemoglobin 12.0 - 15.0 g/dL 8.0(L)  Hematocrit 36.0 - 46.0 % 25.0(L)  Platelets 150 - 400 K/uL 29(LL)    '@IMAGES' @  Dg Chest Portable 1 View  Result Date: 05/29/2019 CLINICAL DATA:  Neutropenic fever EXAM: PORTABLE CHEST 1 VIEW COMPARISON:  December 28, 2017 FINDINGS: There is a dual chamber right-sided Port-A-Cath that is well positioned. The heart size is stable. There is no focal infiltrate. No large pleural effusion. No acute osseous abnormality. IMPRESSION: No active disease. Electronically Signed   By: Constance Holster M.D.   On: 05/29/2019 23:56   Ct Bone Marrow Biopsy & Aspiration  Result Date: 05/06/2019 INDICATION: Pancytopenia of uncertain etiology. Please perform CT-guided bone marrow biopsy for tissue diagnostic purposes. EXAM: CT-GUIDED BONE MARROW BIOPSY AND ASPIRATION MEDICATIONS: None ANESTHESIA/SEDATION: Fentanyl 100 mcg IV; Versed 2 mg IV Sedation Time: 15 Minutes; The patient was continuously monitored during the procedure by the interventional radiology nurse under my direct supervision. COMPLICATIONS: None immediate. PROCEDURE: Informed consent was obtained from the patient following an explanation of the procedure, risks, benefits and alternatives. The patient understands, agrees and consents for the procedure. All questions were addressed. A time out was performed prior to the initiation of the procedure. The patient was positioned prone and non-contrast localization CT  was performed of the pelvis to demonstrate the iliac marrow spaces. The operative site was prepped and draped in the usual sterile fashion. Under sterile conditions and local anesthesia, a 22 gauge spinal needle was utilized for procedural planning. Next, an 11 gauge coaxial bone biopsy needle was advanced into the left iliac marrow space. Needle position was confirmed with CT imaging. Initially, bone marrow aspiration was performed. Next, a bone marrow biopsy was obtained with the 11 gauge outer bone marrow device. The 11 gauge coaxial bone biopsy needle was re-advanced into a slightly different location within the left iliac marrow space, positioning was confirmed and an additional bone marrow biopsy was obtained. Samples were prepared with the cytotechnologist and deemed adequate. The needle was removed intact. Hemostasis was obtained with compression and a dressing was placed. The patient tolerated the procedure well without immediate post procedural complication. IMPRESSION: Successful CT guided left iliac bone marrow aspiration and core biopsy. Electronically Signed   By: Sandi Mariscal M.D.   On: 05/06/2019 09:35     Assessment and plan- Patient is a 73 y.o. 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  Neutropenic fever: I again touch base with Dr. Justin Mend this morning.  Patient continues to have pancytopenia due to bone marrow involvement from malignancy as well as recent chemotherapy.  Her ANC is 0.  Recommend IV cefepime instead of Levaquin while she is inpatient.  She was on Levaquin prophylaxis as an outpatient but still had fever with chemotherapy.  She did receive Neulasta with chemotherapy therefore there is no need to give Neupogen at this time.  Given that she is high risk for neutropenic fever in the setting of high-grade B-cell lymphoma it would be prudent to  observe the patient inpatient for 1 to 2 days prior to discharge  2. Pancytopenia: Transfuse PRBC H&H less than 7/21 or platelets less than 15.  Please transfuse irradiated products  We will continue to follow   Visit Diagnosis 1. Neutropenic fever ([place])   2. Sepsis, due to unspecified organism, unspecified whether acute organ dysfunction present [facility])      Dr. Randa Evens, MD, MPH [facility] [phone] 06/02/2019

## 2019-06-02 NOTE — Progress Notes (Signed)
[place] [facility] [place] NAME: Zoye Chandra    MR#:  [id number]  DATE OF BIRTH:  March 27, 1946  SUBJECTIVE:  CHIEF COMPLAINT:   Chief Complaint  Patient presents with  . Fever   Patient doing well states that she is feeling better has not had any fevers or chills wants to go home   REVIEW OF SYSTEMS:  Review of Systems  Constitutional: Negative for chills and fever.  HENT: Negative for hearing loss and tinnitus.   Eyes: Negative for blurred vision and double vision.  Respiratory: Negative for cough and shortness of breath.   Cardiovascular: Negative for chest pain and palpitations.  Gastrointestinal: Negative for abdominal pain, diarrhea, heartburn, nausea and vomiting.  Genitourinary: Negative for dysuria and urgency.  Musculoskeletal: Negative for myalgias and neck pain.  Skin: Negative for itching and rash.  Neurological: Negative for dizziness and headaches.  Psychiatric/Behavioral: Negative for depression and hallucinations.    DRUG ALLERGIES:   Allergies  Allergen Reactions  . Ace Inhibitors Swelling    enalapril   VITALS:  Blood pressure 138/69, pulse 72, temperature 99.2 F (37.3 C), resp. rate 16, height 5\' 4"  (1.626 m), weight 83.5 kg, SpO2 100 %. PHYSICAL EXAMINATION:  Physical Exam  Constitutional: She is oriented to person, place, and time. She appears well-developed.  HENT:  Head: Normocephalic and atraumatic.  Right Ear: External ear normal.  Eyes: Pupils are equal, round, and reactive to light. Conjunctivae are normal. Right eye exhibits no discharge.  Neck: Normal range of motion. No tracheal deviation present. No thyromegaly present.  Cardiovascular: Normal rate, regular rhythm and normal heart sounds.  Respiratory: Effort normal and breath sounds normal. No respiratory distress.  GI: Soft. She exhibits no distension. There is no abdominal tenderness.  Musculoskeletal: Normal range of motion.        General:  No edema.  Neurological: She is alert and oriented to person, place, and time. No cranial nerve deficit.  Skin: Skin is warm. She is not diaphoretic. No erythema.  Psychiatric: She has a normal mood and affect. Her behavior is normal.    LABORATORY PANEL:  Female CBC Recent Labs  Lab 06/01/19 0531  WBC 0.2*  HGB 8.0*  HCT 25.0*  PLT 29*   ------------------------------------------------------------------------------------------------------------------ Chemistries  Recent Labs  Lab 05/29/19 2209  06/01/19 0531  NA 137   < > 137  K 3.1*   < > 3.5  CL 96*   < > 106  CO2 28   < > 25  GLUCOSE 160*   < > 123*  BUN 16   < > 8  CREATININE 0.76   < > 0.49  CALCIUM 9.1   < > 8.2*  MG  --    < > 1.9  AST 20  --   --   ALT 18  --   --   ALKPHOS 54  --   --   BILITOT 1.1  --   --    < > = values in this interval not displayed.   RADIOLOGY:  No results found. ASSESSMENT AND PLAN:   1.  Sepsis ruled out; present on admission but source not yet found. Continue oral Levaquin So far no growth on cultures.  Chest x-ray and urinalysis negative. This is related to febrile neutropenia  2.  Febrile neutropenia Patient presented with fevers with temperature of 101. Patient with history of lymphoma currently on chemotherapy for which she follows up with [facility]. Now  resolved   3.  Pancytopenia WBC count stable s hemoglobin is stable platelet is low, per hematology and primary oncologist they would like patient to stay today and recheck blood count tomorrow  4.  Recent diagnosis of lymphoma Patient on chemotherapy at [facility]. Discussed with oncology they would like her to stay  5.  Hypertension -blood pressure fairly controlled.     6.GERD (gastroesophageal reflux disease) -home dose PPI  GI prophylaxis; SCDs No heparin products due to thrombocytopenia  Discussed with patient's daughter over the phone  CODE STATUS: Full Code  TOTAL TIME TAKING CARE OF THIS PATIENT: 35 minutes.    More than 50% of the time was spent in counseling/coordination of care: YES  POSSIBLE D/C IN 1-2 DAYS, DEPENDING ON CLINICAL CONDITION.   Dustin Flock M.D on 06/02/2019 at 1:26 PM  Between 7am to 6pm - Pager - [phone]  After 6pm go to [email or web address] - Proofreader  [employer] [place] [facility]  Office  [phone]  CC: Primary care physician; Perrin Maltese, MD  Note: This dictation was prepared with Dragon dictation along with smaller phrase technology. Any transcriptional errors that result from this process are unintentional.

## 2019-06-02 NOTE — Care Management Important Message (Signed)
Important Message  Patient Details  Name: Mercedes Vasquez MRN: [id number] Date of Birth: 04-22-1946   Medicare Important Message Given:  Yes     Juliann Pulse A Donne Baley 06/02/2019, 11:34 AM

## 2019-06-03 LAB — BASIC METABOLIC PANEL
Anion gap: 9 (ref 5–15)
BUN: 9 mg/dL (ref 8–23)
CO2: 24 mmol/L (ref 22–32)
Calcium: 8.5 mg/dL — ABNORMAL LOW (ref 8.9–10.3)
Chloride: 105 mmol/L (ref 98–111)
Creatinine, Ser: 0.47 mg/dL (ref 0.44–1.00)
GFR calc Af Amer: >60 mL/min (ref >60)
GFR calc non Af Amer: >60 mL/min (ref >60)
Glucose, Bld: 118 mg/dL — ABNORMAL HIGH (ref 70–99)
Potassium: 3.3 mmol/L — ABNORMAL LOW (ref 3.5–5.1)
Sodium: 138 mmol/L (ref 135–145)

## 2019-06-03 LAB — CBC
HCT: 23.1 % — ABNORMAL LOW (ref 36.0–46.0)
Hemoglobin: 7.3 g/dL — ABNORMAL LOW (ref 12.0–15.0)
MCH: 27.7 pg (ref 26.0–34.0)
MCHC: 31.6 g/dL (ref 30.0–36.0)
MCV: 87.5 fL (ref 80.0–100.0)
Platelets: 34 10*3/uL — ABNORMAL LOW (ref 150–400)
RBC: 2.64 MIL/uL — ABNORMAL LOW (ref 3.87–5.11)
RDW: 17.2 % — ABNORMAL HIGH (ref 11.5–15.5)
WBC: 0.6 10*3/uL — CL (ref 4.0–10.5)
nRBC: 0 % (ref 0.0–0.2)

## 2019-06-03 LAB — CULTURE, BLOOD (ROUTINE X 2)
Culture: NO GROWTH
Culture: NO GROWTH

## 2019-06-03 MED ORDER — LEVOFLOXACIN 750 MG PO TABS: 750.0000 mg | ORAL_TABLET | Freq: Every day | ORAL | 0 refills | Status: AC

## 2019-06-03 NOTE — Discharge Summary (Signed)
[facility] - [place] at [facility], 73 y.o., DOB [birth year], MRN [id number]. Admission date: 05/29/2019 Discharge Date 06/03/2019 Primary MD Perrin Maltese, MD Admitting Physician Lance Coon, MD  Admission Diagnosis  Neutropenic fever ([place]) [D70.9, R50.81] Sepsis, due to unspecified organism, unspecified whether acute organ dysfunction present [facility]) [A41.9]  Discharge Diagnosis   Principal Problem:   Sepsis ([place]) Febrile neutropenia Pancytopenia due to chemo   Lymphoma ([place])   Neutropenic fever ([place])   GERD (gastroesophageal reflux disease)   HLD (hyperlipidemia)   Antineoplastic chemotherapy induced pancytopenia [facility])           Hospital Course  Patient 73 year old female with 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  Patient was noted to have pancytopenia.  Patient did require 1 unit of packed RBCs.  Her hemoglobin is stable.  Platelet count is also improved.  She did receive Neupogen and WBC count is improved.  Patient was recommended to continue empiric Levaquin for oncology.  All her cultures are negative.  She has not had any further fevers.            Consults  hematology/oncology  Significant Tests:  See full reports for all details     Dg Chest Portable 1 View  Result Date: 05/29/2019 CLINICAL DATA:  Neutropenic fever EXAM: PORTABLE CHEST 1 VIEW COMPARISON:  December 28, 2017 FINDINGS: There is a dual chamber right-sided Port-A-Cath that is well positioned. The heart size is stable. There is no focal infiltrate. No large pleural effusion. No acute osseous abnormality. IMPRESSION: No active disease. Electronically Signed   By: Constance Holster M.D.   On: 05/29/2019 23:56   Ct Bone Marrow Biopsy & Aspiration  Result Date: 05/06/2019 INDICATION: Pancytopenia of uncertain etiology. Please perform CT-guided bone marrow biopsy for tissue diagnostic purposes. EXAM: CT-GUIDED BONE  MARROW BIOPSY AND ASPIRATION MEDICATIONS: None ANESTHESIA/SEDATION: Fentanyl 100 mcg IV; Versed 2 mg IV Sedation Time: 15 Minutes; The patient was continuously monitored during the procedure by the interventional radiology nurse under my direct supervision. COMPLICATIONS: None immediate. PROCEDURE: Informed consent was obtained from the patient following an explanation of the procedure, risks, benefits and alternatives. The patient understands, agrees and consents for the procedure. All questions were addressed. A time out was performed prior to the initiation of the procedure. The patient was positioned prone and non-contrast localization CT was performed of the pelvis to demonstrate the iliac marrow spaces. The operative site was prepped and draped in the usual sterile fashion. Under sterile conditions and local anesthesia, a 22 gauge spinal needle was utilized for procedural planning. Next, an 11 gauge coaxial bone biopsy needle was advanced into the left iliac marrow space. Needle position was confirmed with CT imaging. Initially, bone marrow aspiration was performed. Next, a bone marrow biopsy was obtained with the 11 gauge outer bone marrow device. The 11 gauge coaxial bone biopsy needle was re-advanced into a slightly different location within the left iliac marrow space, positioning was confirmed and an additional bone marrow biopsy was obtained. Samples were prepared with the cytotechnologist and deemed adequate. The needle was removed intact. Hemostasis was obtained with compression and a dressing was placed. The patient tolerated the procedure well without immediate post procedural complication. IMPRESSION: Successful CT guided left iliac bone marrow aspiration and core biopsy. Electronically Signed   By: Sandi Mariscal M.D.   On: 05/06/2019 09:35       Today   Subjective:  Mercedes Vasquez patient denying any complaints wants to go home Objective:   Blood pressure 134/60, pulse 76, temperature 98.9  F (37.2 C), temperature source Oral, resp. rate 18, height '5\' 4"'  (1.626 m), weight 83.5 kg, SpO2 100 %.  . No intake or output data in the 24 hours ending 06/03/19 1242  Exam VITAL SIGNS: Blood pressure 134/60, pulse 76, temperature 98.9 F (37.2 C), temperature source Oral, resp. rate 18, height '5\' 4"'  (1.626 m), weight 83.5 kg, SpO2 100 %.  GENERAL:  73 y.o.-year-old patient lying in the bed with no acute distress.  EYES: Pupils equal, round, reactive to light and accommodation. No scleral icterus. Extraocular muscles intact.  HEENT: Head atraumatic, normocephalic. Oropharynx and nasopharynx clear.  NECK:  Supple, no jugular venous distention. No thyroid enlargement, no tenderness.  LUNGS: Normal breath sounds bilaterally, no wheezing, rales,rhonchi or crepitation. No use of accessory muscles of respiration.  CARDIOVASCULAR: S1, S2 normal. No murmurs, rubs, or gallops.  ABDOMEN: Soft, nontender, nondistended. Bowel sounds present. No organomegaly or mass.  EXTREMITIES: No pedal edema, cyanosis, or clubbing.  NEUROLOGIC: Cranial nerves II through XII are intact. Muscle strength 5/5 in all extremities. Sensation intact. Gait not checked.  PSYCHIATRIC: The patient is alert and oriented x 3.  SKIN: No obvious rash, lesion, or ulcer.   Data Review     CBC w Diff:  Lab Results  Component Value Date   WBC 0.6 (LL) 06/03/2019   HGB 7.3 (L) 06/03/2019   HGB 12.1 10/28/2012   HCT 23.1 (L) 06/03/2019   HCT 36.6 10/28/2012   PLT 34 (L) 06/03/2019   PLT 135 (L) 10/28/2012   LYMPHOPCT 59 06/02/2019   LYMPHOPCT 41.7 10/28/2012   MONOPCT 2 06/02/2019   MONOPCT 11.0 10/28/2012   EOSPCT 16 06/02/2019   EOSPCT 4.3 10/28/2012   BASOPCT 0 06/02/2019   BASOPCT 0.9 10/28/2012   CMP:  Lab Results  Component Value Date   NA 138 06/03/2019   K 3.3 (L) 06/03/2019   CL 105 06/03/2019   CO2 24 06/03/2019   BUN 9 06/03/2019   CREATININE 0.47 06/03/2019   PROT 6.5 05/29/2019   ALBUMIN 3.7  05/29/2019   BILITOT 1.1 05/29/2019   ALKPHOS 54 05/29/2019   AST 20 05/29/2019   ALT 18 05/29/2019  .  Micro Results Recent Results (from the past 240 hour(s))  Culture, blood (Routine x 2)     Status: None   Collection Time: 05/29/19  9:50 PM   Specimen: BLOOD  Result Value Ref Range Status   Specimen Description BLOOD LEFT ANTECUBITAL  Final   Special Requests BOTTLES DRAWN AEROBIC AND ANAEROBIC  Final   Culture   Final    NO GROWTH 5 DAYS Performed at [facility], [address], [place], [place] [address]    Report Status 06/03/2019 FINAL  Final  Urine culture     Status: None   Collection Time: 05/29/19  9:50 PM   Specimen: In/Out Cath Urine  Result Value Ref Range Status   Specimen Description   Final    IN/OUT CATH URINE Performed at [facility], [address], [place], [place] [address]    Special Requests   Final    NONE Performed at [facility], [address] [place], [place] [address]    Culture   Final    NO GROWTH Performed at [facility], [place] [address] [place], [place] [address]    Report Status 05/31/2019 FINAL  Final  Culture, blood (Routine x 2)     Status: None   Collection Time: 05/29/19 10:09 PM   Specimen: BLOOD  Result Value Ref Range Status   Specimen Description BLOOD RIGHT ANTECUBITAL  Final   Special Requests   Final    BOTTLES DRAWN AEROBIC AND ANAEROBIC Blood Culture results may not be optimal due to an excessive volume of blood received in culture bottles   Culture   Final    NO GROWTH 5 DAYS Performed at [facility], [address] [place], [place] [address]    Report Status 06/03/2019 FINAL  Final  SARS Coronavirus 2 [facility] order, Performed in [facility] hospital lab) Nasopharyngeal Nasopharyngeal Swab     Status: None   Collection Time: 05/29/19 11:13 PM   Specimen: Nasopharyngeal Swab  Result Value Ref Range Status   SARS Coronavirus 2 NEGATIVE NEGATIVE Final     Comment: (NOTE) If result is NEGATIVE SARS-CoV-2 target nucleic acids are NOT DETECTED. The SARS-CoV-2 RNA is generally detectable in upper and lower  respiratory specimens during the acute phase of infection. The lowest  concentration of SARS-CoV-2 viral copies this assay can detect is 250  copies / mL. A negative result does not preclude SARS-CoV-2 infection  and should not be used as the sole basis for treatment or other  patient management decisions.  A negative result may occur with  improper specimen collection / handling, submission of specimen other  than nasopharyngeal swab, presence of viral mutation(s) within the  areas targeted by this assay, and inadequate number of viral copies  (<250 copies / mL). A negative result must be combined with clinical  observations, patient history, and epidemiological information. If result is POSITIVE SARS-CoV-2 target nucleic acids are DETECTED. The SARS-CoV-2 RNA is generally detectable in upper and lower  respiratory specimens dur ing the acute phase of infection.  Positive  results are indicative of active infection with SARS-CoV-2.  Clinical  correlation with patient history and other diagnostic information is  necessary to determine patient infection status.  Positive results do  not rule out bacterial infection or co-infection with other viruses. If result is PRESUMPTIVE POSTIVE SARS-CoV-2 nucleic acids MAY BE PRESENT.   A presumptive positive result was obtained on the submitted specimen  and confirmed on repeat testing.  While 2019 novel coronavirus  (SARS-CoV-2) nucleic acids may be present in the submitted sample  additional confirmatory testing may be necessary for epidemiological  and / or clinical management purposes  to differentiate between  SARS-CoV-2 and other Sarbecovirus currently known to infect humans.  If clinically indicated additional testing with an alternate test  methodology [id number]) is advised. The SARS-CoV-2  RNA is generally  detectable in upper and lower respiratory sp ecimens during the acute  phase of infection. The expected result is Negative. Fact Sheet for Patients:  StrictlyIdeas.no Fact Sheet for Healthcare Providers: BankingDealers.co.za This test is not yet approved or cleared by the [place] FDA and has been authorized for detection and/or diagnosis of SARS-CoV-2 by FDA under an Emergency Use Authorization (EUA).  This EUA will remain in effect (meaning this test can be used) for the duration of the COVID-19 declaration under Section 564(b)(1) of the Act, 21 U.S.C. section 360bbb-3(b)(1), unless the authorization is terminated or revoked sooner. Performed at [facility], [address], [place], [place] [address]         Code Status Orders  (From admission, onward)  Start     Ordered   05/30/19 0416  Full code  Continuous     05/30/19 0415        Code Status History    This patient has a current code status but no historical code status.   Advance Care Planning Activity          Follow-up Information    Perrin Maltese, MD. Go on 06/07/2019.   Specialty: Internal Medicine Why: '@11' :30 AM  Contact information: [place] [address] [phone]           Discharge Medications   Allergies as of 06/03/2019      Reactions   Ace Inhibitors Swelling   enalapril      Medication List    TAKE these medications   acetaminophen 500 MG tablet Commonly known as: TYLENOL Take 1,000 mg by mouth every 6 (six) hours.   allopurinol 300 MG tablet Commonly known as: ZYLOPRIM Take 300 mg by mouth daily.   amLODipine 5 MG tablet Commonly known as: NORVASC Take 5 mg by mouth daily. 11am   atorvastatin 20 MG tablet Commonly known as: LIPITOR Take 20 mg by mouth daily. 11am   cetirizine 10 MG tablet Commonly known as: ZYRTEC Take 10 mg by mouth at bedtime.    citalopram 20 MG tablet Commonly known as: CELEXA Take 20 mg by mouth daily.   docusate sodium 100 MG capsule Commonly known as: Colace Take 1 tablet once or twice daily as needed for constipation while taking narcotic pain medicine What changed:   how much to take  how to take this  when to take this  reasons to take this  additional instructions   ergocalciferol 1.25 MG (50000 UT) capsule Commonly known as: VITAMIN D2 Take 50,000 Units by mouth every Friday.   Fish Oil 1000 MG Cpdr Take 1 capsule by mouth daily.   fluconazole 200 MG tablet Commonly known as: DIFLUCAN Take 400 mg by mouth daily.   fluticasone 50 MCG/ACT nasal spray Commonly known as: FLONASE Place 2 sprays into the nose daily as needed for allergies.   folic acid 1 MG tablet Commonly known as: FOLVITE Take 1 mg by mouth daily.   gabapentin 100 MG capsule Commonly known as: NEURONTIN Take 100 mg by mouth 2 (two) times daily.   hydrochlorothiazide 12.5 MG capsule Commonly known as: MICROZIDE Take 12.5 mg by mouth daily.   ibandronate 150 MG tablet Commonly known as: BONIVA Take 150 mg by mouth every 30 (thirty) days. Take in the morning with a full glass of water, on an empty stomach, and do not take anything else by mouth or lie down for the next 30 min.   levofloxacin 750 MG tablet Commonly known as: LEVAQUIN Take 1 tablet (750 mg total) by mouth daily for 3 days. What changed:   medication strength  how much to take   LORazepam 1 MG tablet Commonly known as: ATIVAN Take 1 mg by mouth at bedtime as needed for sleep.   omeprazole 40 MG capsule Commonly known as: PRILOSEC Take 40 mg by mouth daily.   potassium chloride SA 20 MEQ tablet Commonly known as: K-DUR Take 20 mEq by mouth daily.   PROBIOTIC ACIDOPHILUS PO Take 1 capsule by mouth daily.   valACYclovir 500 MG tablet Commonly known as: VALTREX Take 500 mg by mouth daily.          Total Time in preparing paper  work, data evaluation and todays exam -  35 minutes  Dustin Flock M.D on 06/03/2019 at 12:42 PM [facility]   Office  [phone]

## 2019-06-16 DIAGNOSIS — E876 Hypokalemia: Secondary | ICD-10-CM | POA: Insufficient documentation

## 2019-10-17 ENCOUNTER — Telehealth: Payer: Self-pay | Admitting: *Deleted

## 2019-10-17 NOTE — Telephone Encounter (Signed)
Ok to get port flushes

## 2019-10-17 NOTE — Telephone Encounter (Signed)
Schedule message sent. 

## 2019-10-17 NOTE — Telephone Encounter (Signed)
Daughter called asking if patient can come to our office to get her port flushes every 6 weeks so they do not have to travel to [facility] to get it done. Please advise

## 2019-10-25 ENCOUNTER — Telehealth: Payer: Self-pay | Admitting: *Deleted

## 2019-10-25 NOTE — Telephone Encounter (Signed)
Called Lisa-patient's daughter to let her know that Dr. Janese Banks is able to come here to have her port flushed every 12 weeks. Lattie Haw stated that she would call [facility] to ask them if port flushes are ok to be flushed every 12 weeks. Lattie Haw would call me back with a response if her mom will have them done here or not.

## 2019-10-25 NOTE — Telephone Encounter (Signed)
Mercedes Vasquez stated that her mother will need her port flushes every 6 weeks. She will let us know if her mother would do her port flushes here.

## 2019-10-25 NOTE — Telephone Encounter (Signed)
Patient's daughter called to see if her mother could get her port flushes done here at [facility]. She was a patient of Dr. Elroy Channel but care was transferred to [facility]. Lattie Haw would like to be called with information regarding this request.

## 2019-10-26 ENCOUNTER — Telehealth: Payer: Self-pay | Admitting: *Deleted

## 2019-10-26 NOTE — Telephone Encounter (Signed)
Daughter called today and said that dr Lanelle Bal grover states that port has to be flushed every 6 weeks even though our facility has went to 12 weeks flush on ports. I told daughter that I would put in  A note to keep appts the same that has already been given which is appts every 6 weeks. Daughter was thankful for doing it

## 2019-11-14 ENCOUNTER — Other Ambulatory Visit: Payer: Self-pay

## 2019-11-15 ENCOUNTER — Inpatient Hospital Stay: Payer: Medicare HMO | Attending: Oncology

## 2019-11-15 ENCOUNTER — Other Ambulatory Visit: Payer: Self-pay

## 2019-11-15 DIAGNOSIS — Z452 Encounter for adjustment and management of vascular access device: Secondary | ICD-10-CM | POA: Insufficient documentation

## 2019-11-15 DIAGNOSIS — Z95828 Presence of other vascular implants and grafts: Secondary | ICD-10-CM

## 2019-11-15 DIAGNOSIS — C8300 Small cell B-cell lymphoma, unspecified site: Secondary | ICD-10-CM | POA: Insufficient documentation

## 2019-11-15 MED ORDER — HEPARIN SOD (PORK) LOCK FLUSH 100 UNIT/ML IV SOLN
500.0000 [IU] | Freq: Once | INTRAVENOUS | Status: AC
  Administered 2019-11-15: 14:00:00 500 [IU] via INTRAVENOUS
  Filled 2019-11-15: qty 5

## 2019-11-15 MED ORDER — SODIUM CHLORIDE 0.9% FLUSH
10.0000 mL | Freq: Once | INTRAVENOUS | Status: AC
  Administered 2019-11-15: 14:00:00 10 mL via INTRAVENOUS
  Filled 2019-11-15: qty 10

## 2020-02-07 ENCOUNTER — Inpatient Hospital Stay: Payer: Medicare HMO | Attending: Oncology

## 2020-02-24 DIAGNOSIS — E883 Tumor lysis syndrome: Secondary | ICD-10-CM | POA: Insufficient documentation

## 2020-07-30 ENCOUNTER — Other Ambulatory Visit: Payer: Self-pay | Admitting: Internal Medicine

## 2020-08-06 ENCOUNTER — Other Ambulatory Visit: Payer: Self-pay | Admitting: Family

## 2020-08-06 DIAGNOSIS — Z1231 Encounter for screening mammogram for malignant neoplasm of breast: Secondary | ICD-10-CM

## 2020-09-11 ENCOUNTER — Ambulatory Visit
Admission: RE | Admit: 2020-09-11 | Discharge: 2020-09-11 | Disposition: A | Discharge status: Home / Self Care | Payer: Medicare HMO | Source: Ambulatory Visit | Attending: Family | Admitting: Family

## 2020-09-11 ENCOUNTER — Other Ambulatory Visit: Payer: Self-pay

## 2020-09-11 DIAGNOSIS — Z1231 Encounter for screening mammogram for malignant neoplasm of breast: Secondary | ICD-10-CM | POA: Diagnosis not present

## 2020-10-23 ENCOUNTER — Ambulatory Visit: Payer: Medicare HMO | Admitting: Physical Therapy

## 2020-10-30 ENCOUNTER — Encounter: Payer: Medicare HMO | Admitting: Physical Therapy

## 2020-10-31 DIAGNOSIS — C8338 Diffuse large B-cell lymphoma, lymph nodes of multiple sites: Secondary | ICD-10-CM | POA: Insufficient documentation

## 2020-11-01 ENCOUNTER — Encounter: Payer: Medicare HMO | Admitting: Physical Therapy

## 2020-11-06 ENCOUNTER — Encounter: Payer: Medicare HMO | Admitting: Physical Therapy

## 2020-11-08 ENCOUNTER — Encounter: Payer: Medicare HMO | Admitting: Physical Therapy

## 2020-11-12 ENCOUNTER — Encounter: Payer: Medicare HMO | Admitting: Physical Therapy

## 2020-11-14 ENCOUNTER — Encounter: Payer: Medicare HMO | Admitting: Physical Therapy

## 2020-11-19 ENCOUNTER — Encounter: Payer: Medicare HMO | Admitting: Physical Therapy

## 2020-11-21 ENCOUNTER — Encounter: Payer: Medicare HMO | Admitting: Physical Therapy

## 2020-12-09 IMAGING — MG DIGITAL SCREENING BILAT W/ TOMO W/ CAD
8 series · 8 of 24 positions shown · non-contrast
Comparison: Previous exam(s).

CLINICAL DATA: Screening.

EXAM:
DIGITAL SCREENING BILATERAL MAMMOGRAM WITH TOMO AND CAD

[R MLO synth-2D]
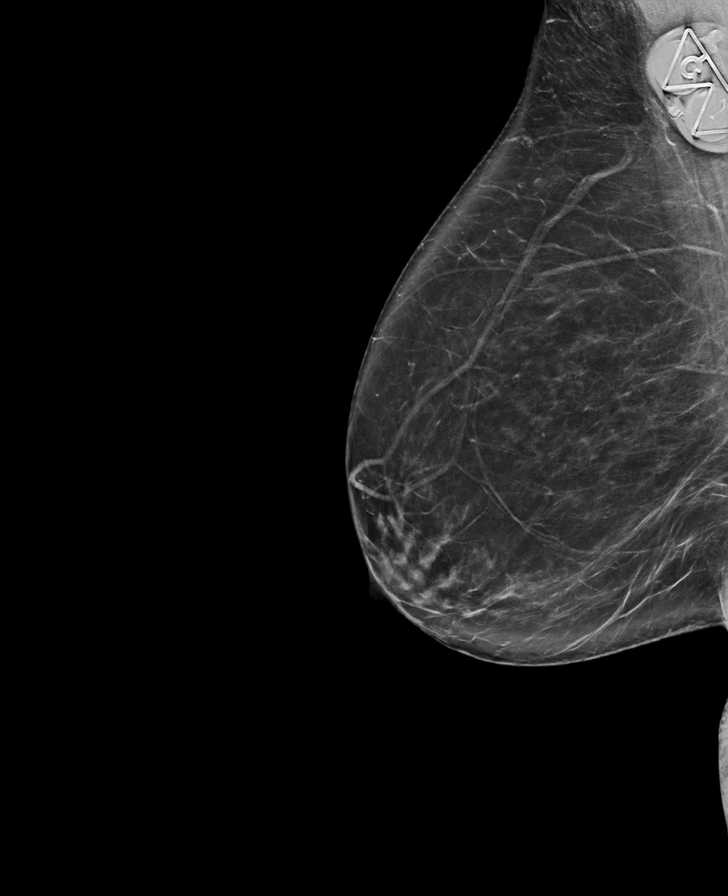

[L MLO synth-2D]
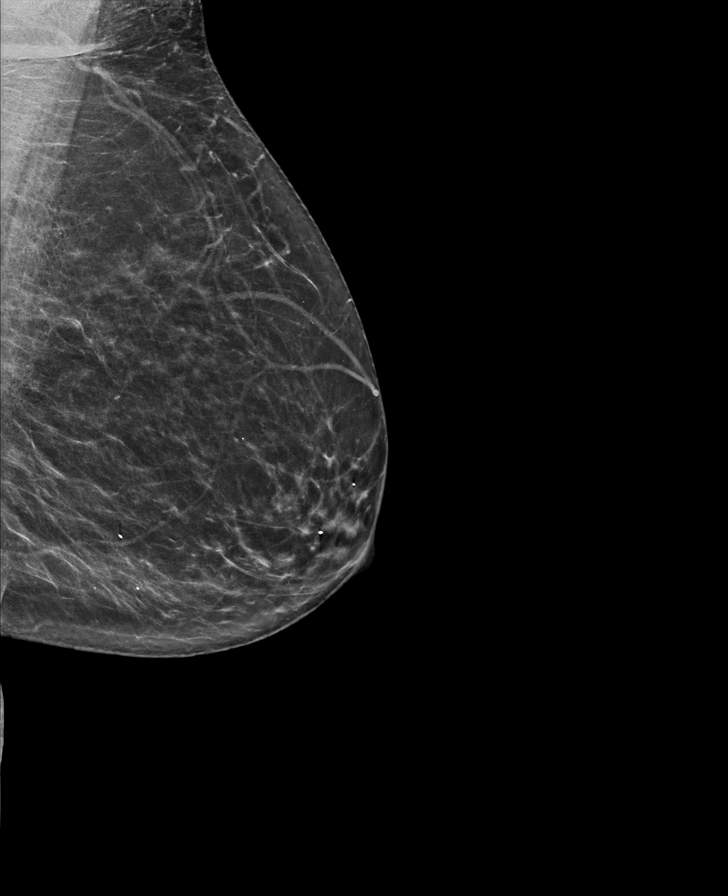

[L CC synth-2D]
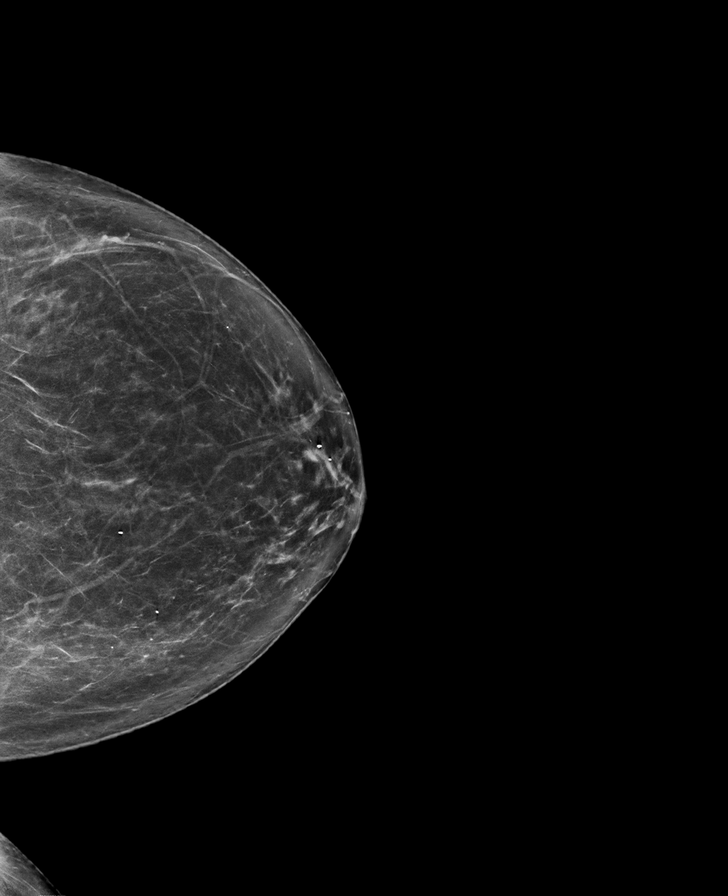

[R CC synth-2D]
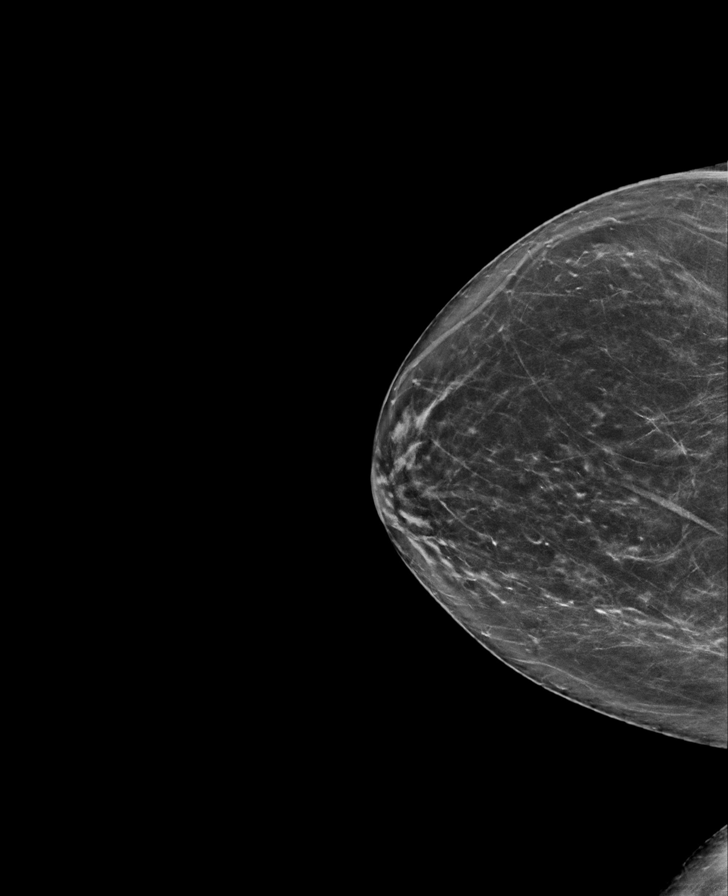

[R CC tomo · tomo slice 39/78.0]
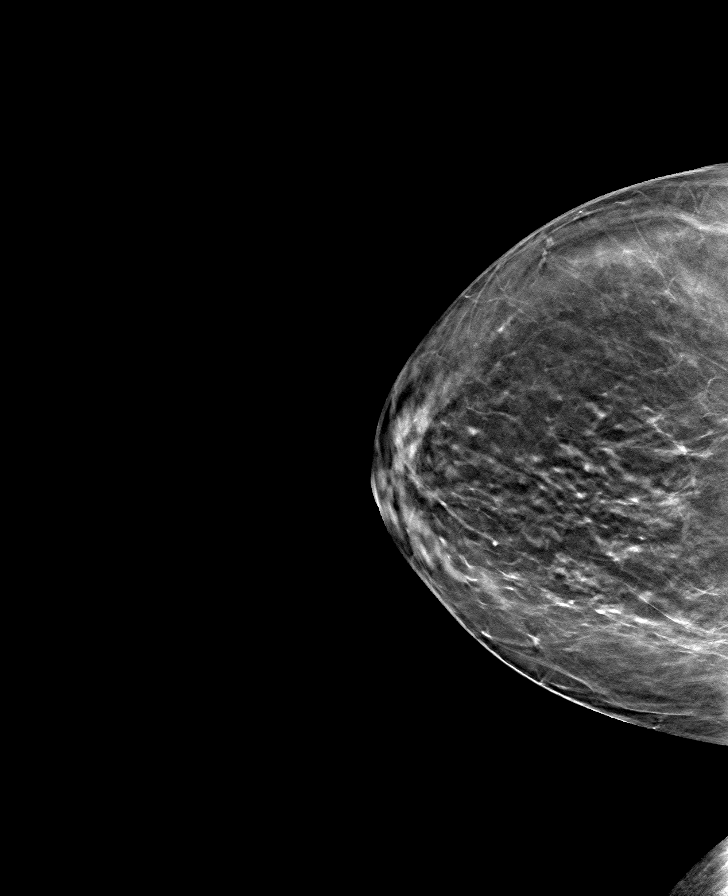

[R MLO tomo · tomo slice 43/85.0]
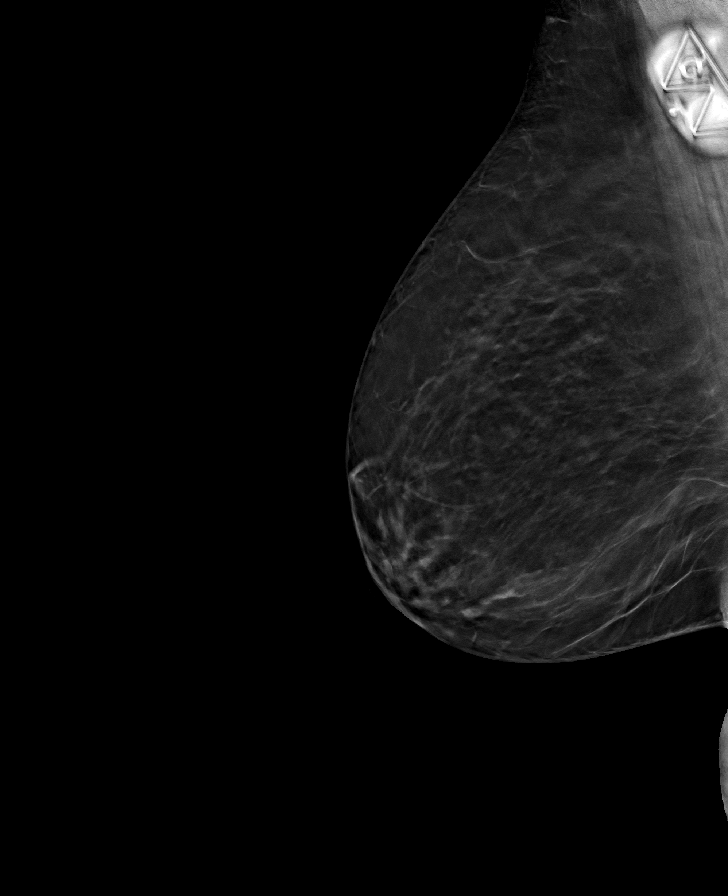

[L MLO tomo · tomo slice 35/68.0]
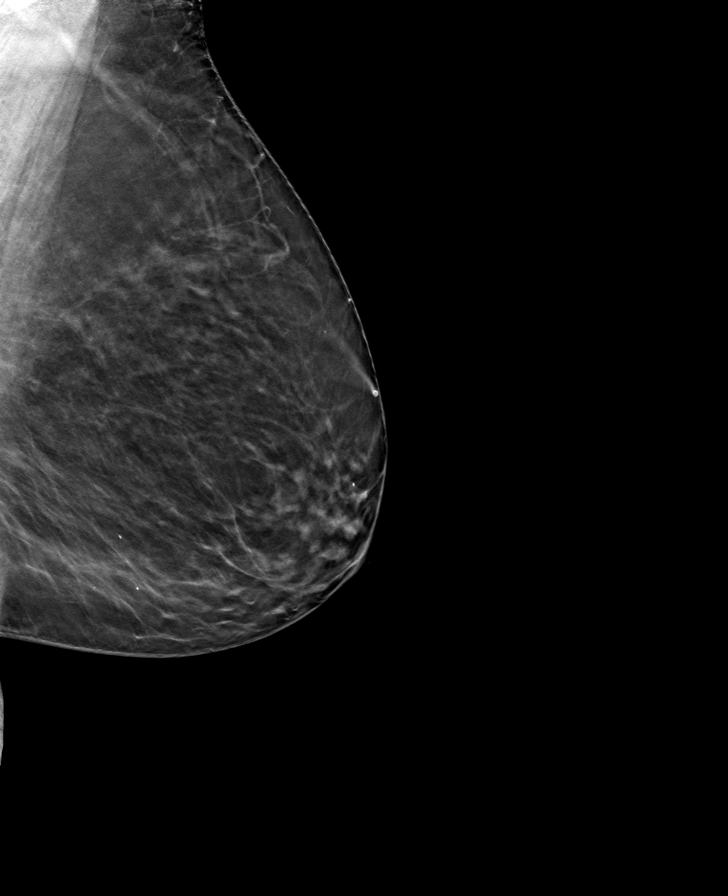

[L CC tomo · tomo slice 38/75.0]
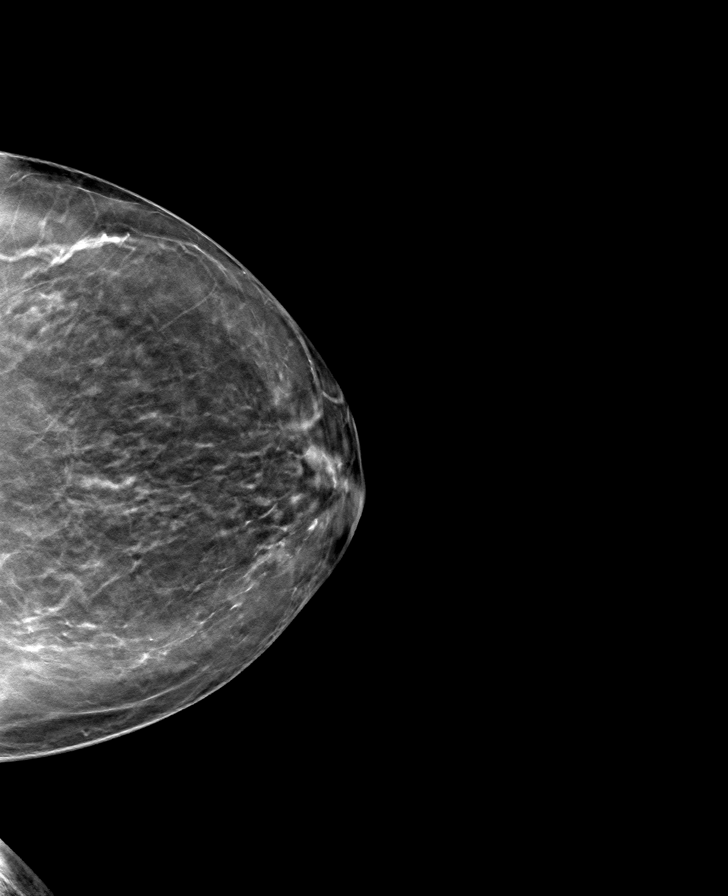

[8 of 24 positions shown; findings below may reference images not displayed]

ACR Breast Density Category b: There are scattered areas of
fibroglandular density.
FINDINGS: There are no findings suspicious for malignancy. Images were
processed with CAD.
IMPRESSION: No mammographic evidence of malignancy. A result letter of this
screening mammogram will be mailed directly to the patient.

RECOMMENDATION:
Screening mammogram in one year. (Code:[id number])

BI-RADS CATEGORY  1: Negative.

## 2021-03-20 DIAGNOSIS — C8338 Diffuse large B-cell lymphoma, lymph nodes of multiple sites: Secondary | ICD-10-CM | POA: Diagnosis not present

## 2021-04-01 DIAGNOSIS — Z8572 Personal history of non-Hodgkin lymphomas: Secondary | ICD-10-CM | POA: Diagnosis not present

## 2021-04-01 DIAGNOSIS — E876 Hypokalemia: Secondary | ICD-10-CM | POA: Diagnosis not present

## 2021-04-01 DIAGNOSIS — Z7901 Long term (current) use of anticoagulants: Secondary | ICD-10-CM | POA: Diagnosis not present

## 2021-04-01 DIAGNOSIS — I10 Essential (primary) hypertension: Secondary | ICD-10-CM | POA: Diagnosis not present

## 2021-04-01 DIAGNOSIS — C8338 Diffuse large B-cell lymphoma, lymph nodes of multiple sites: Secondary | ICD-10-CM | POA: Diagnosis not present

## 2021-04-01 DIAGNOSIS — E785 Hyperlipidemia, unspecified: Secondary | ICD-10-CM | POA: Diagnosis not present

## 2021-04-01 DIAGNOSIS — C8308 Small cell B-cell lymphoma, lymph nodes of multiple sites: Secondary | ICD-10-CM | POA: Diagnosis not present

## 2021-04-03 DIAGNOSIS — I10 Essential (primary) hypertension: Secondary | ICD-10-CM | POA: Diagnosis not present

## 2021-04-03 DIAGNOSIS — E785 Hyperlipidemia, unspecified: Secondary | ICD-10-CM | POA: Diagnosis not present

## 2021-04-03 DIAGNOSIS — C8338 Diffuse large B-cell lymphoma, lymph nodes of multiple sites: Secondary | ICD-10-CM | POA: Diagnosis not present

## 2021-04-11 DIAGNOSIS — R000 Tachycardia, unspecified: Secondary | ICD-10-CM | POA: Diagnosis not present

## 2021-04-11 DIAGNOSIS — K219 Gastro-esophageal reflux disease without esophagitis: Secondary | ICD-10-CM | POA: Diagnosis not present

## 2021-04-11 DIAGNOSIS — E872 Acidosis: Secondary | ICD-10-CM | POA: Diagnosis not present

## 2021-04-11 DIAGNOSIS — D849 Immunodeficiency, unspecified: Secondary | ICD-10-CM | POA: Diagnosis not present

## 2021-04-11 DIAGNOSIS — Z87891 Personal history of nicotine dependence: Secondary | ICD-10-CM | POA: Diagnosis not present

## 2021-04-11 DIAGNOSIS — R1031 Right lower quadrant pain: Secondary | ICD-10-CM | POA: Diagnosis not present

## 2021-04-11 DIAGNOSIS — C8338 Diffuse large B-cell lymphoma, lymph nodes of multiple sites: Secondary | ICD-10-CM | POA: Diagnosis not present

## 2021-04-11 DIAGNOSIS — R509 Fever, unspecified: Secondary | ICD-10-CM | POA: Diagnosis not present

## 2021-04-11 DIAGNOSIS — E876 Hypokalemia: Secondary | ICD-10-CM | POA: Diagnosis not present

## 2021-04-11 DIAGNOSIS — C9102 Acute lymphoblastic leukemia, in relapse: Secondary | ICD-10-CM | POA: Diagnosis not present

## 2021-04-11 DIAGNOSIS — E8809 Other disorders of plasma-protein metabolism, not elsewhere classified: Secondary | ICD-10-CM | POA: Diagnosis not present

## 2021-04-11 DIAGNOSIS — U071 COVID-19: Secondary | ICD-10-CM | POA: Diagnosis not present

## 2021-04-11 DIAGNOSIS — E785 Hyperlipidemia, unspecified: Secondary | ICD-10-CM | POA: Diagnosis not present

## 2021-04-11 DIAGNOSIS — E871 Hypo-osmolality and hyponatremia: Secondary | ICD-10-CM | POA: Diagnosis not present

## 2021-04-11 DIAGNOSIS — D61818 Other pancytopenia: Secondary | ICD-10-CM | POA: Diagnosis not present

## 2021-04-12 DIAGNOSIS — F418 Other specified anxiety disorders: Secondary | ICD-10-CM | POA: Insufficient documentation

## 2021-04-24 DIAGNOSIS — M791 Myalgia, unspecified site: Secondary | ICD-10-CM | POA: Diagnosis not present

## 2021-04-24 DIAGNOSIS — C8338 Diffuse large B-cell lymphoma, lymph nodes of multiple sites: Secondary | ICD-10-CM | POA: Diagnosis not present

## 2021-04-24 DIAGNOSIS — Z79899 Other long term (current) drug therapy: Secondary | ICD-10-CM | POA: Diagnosis not present

## 2021-04-24 DIAGNOSIS — I10 Essential (primary) hypertension: Secondary | ICD-10-CM | POA: Diagnosis not present

## 2021-04-24 DIAGNOSIS — R638 Other symptoms and signs concerning food and fluid intake: Secondary | ICD-10-CM | POA: Diagnosis not present

## 2021-04-24 DIAGNOSIS — Z8249 Family history of ischemic heart disease and other diseases of the circulatory system: Secondary | ICD-10-CM | POA: Diagnosis not present

## 2021-04-24 DIAGNOSIS — C8590 Non-Hodgkin lymphoma, unspecified, unspecified site: Secondary | ICD-10-CM | POA: Diagnosis not present

## 2021-04-24 DIAGNOSIS — C8518 Unspecified B-cell lymphoma, lymph nodes of multiple sites: Secondary | ICD-10-CM | POA: Diagnosis not present

## 2021-04-24 DIAGNOSIS — E785 Hyperlipidemia, unspecified: Secondary | ICD-10-CM | POA: Diagnosis not present

## 2021-04-24 DIAGNOSIS — R110 Nausea: Secondary | ICD-10-CM | POA: Diagnosis not present

## 2021-04-24 DIAGNOSIS — G629 Polyneuropathy, unspecified: Secondary | ICD-10-CM | POA: Diagnosis not present

## 2021-05-03 DIAGNOSIS — Z9221 Personal history of antineoplastic chemotherapy: Secondary | ICD-10-CM | POA: Diagnosis not present

## 2021-05-03 DIAGNOSIS — D61810 Antineoplastic chemotherapy induced pancytopenia: Secondary | ICD-10-CM | POA: Diagnosis not present

## 2021-05-03 DIAGNOSIS — Z79899 Other long term (current) drug therapy: Secondary | ICD-10-CM | POA: Diagnosis not present

## 2021-05-03 DIAGNOSIS — R918 Other nonspecific abnormal finding of lung field: Secondary | ICD-10-CM | POA: Diagnosis not present

## 2021-05-03 DIAGNOSIS — E871 Hypo-osmolality and hyponatremia: Secondary | ICD-10-CM | POA: Diagnosis not present

## 2021-05-03 DIAGNOSIS — R109 Unspecified abdominal pain: Secondary | ICD-10-CM | POA: Diagnosis not present

## 2021-05-03 DIAGNOSIS — Z0389 Encounter for observation for other suspected diseases and conditions ruled out: Secondary | ICD-10-CM | POA: Diagnosis not present

## 2021-05-03 DIAGNOSIS — D61818 Other pancytopenia: Secondary | ICD-10-CM | POA: Diagnosis not present

## 2021-05-03 DIAGNOSIS — U071 COVID-19: Secondary | ICD-10-CM | POA: Diagnosis not present

## 2021-05-03 DIAGNOSIS — J1282 Pneumonia due to coronavirus disease 2019: Secondary | ICD-10-CM | POA: Diagnosis not present

## 2021-05-03 DIAGNOSIS — C8338 Diffuse large B-cell lymphoma, lymph nodes of multiple sites: Secondary | ICD-10-CM | POA: Diagnosis not present

## 2021-05-03 DIAGNOSIS — L270 Generalized skin eruption due to drugs and medicaments taken internally: Secondary | ICD-10-CM | POA: Diagnosis not present

## 2021-05-03 DIAGNOSIS — I10 Essential (primary) hypertension: Secondary | ICD-10-CM | POA: Diagnosis not present

## 2021-05-03 DIAGNOSIS — R42 Dizziness and giddiness: Secondary | ICD-10-CM | POA: Diagnosis not present

## 2021-05-03 DIAGNOSIS — A4189 Other specified sepsis: Secondary | ICD-10-CM | POA: Diagnosis not present

## 2021-05-03 DIAGNOSIS — R197 Diarrhea, unspecified: Secondary | ICD-10-CM | POA: Diagnosis not present

## 2021-05-03 DIAGNOSIS — N179 Acute kidney failure, unspecified: Secondary | ICD-10-CM | POA: Diagnosis not present

## 2021-05-03 DIAGNOSIS — L819 Disorder of pigmentation, unspecified: Secondary | ICD-10-CM | POA: Diagnosis not present

## 2021-05-03 DIAGNOSIS — D709 Neutropenia, unspecified: Secondary | ICD-10-CM | POA: Diagnosis not present

## 2021-05-03 DIAGNOSIS — C8330 Diffuse large B-cell lymphoma, unspecified site: Secondary | ICD-10-CM | POA: Diagnosis not present

## 2021-05-03 DIAGNOSIS — L509 Urticaria, unspecified: Secondary | ICD-10-CM | POA: Diagnosis not present

## 2021-05-03 DIAGNOSIS — R5081 Fever presenting with conditions classified elsewhere: Secondary | ICD-10-CM | POA: Diagnosis not present

## 2021-05-03 DIAGNOSIS — R509 Fever, unspecified: Secondary | ICD-10-CM | POA: Diagnosis not present

## 2021-05-03 DIAGNOSIS — R21 Rash and other nonspecific skin eruption: Secondary | ICD-10-CM | POA: Diagnosis not present

## 2021-05-04 DIAGNOSIS — E871 Hypo-osmolality and hyponatremia: Secondary | ICD-10-CM | POA: Insufficient documentation

## 2021-05-15 DIAGNOSIS — Z8616 Personal history of COVID-19: Secondary | ICD-10-CM | POA: Diagnosis not present

## 2021-05-15 DIAGNOSIS — I10 Essential (primary) hypertension: Secondary | ICD-10-CM | POA: Diagnosis not present

## 2021-05-15 DIAGNOSIS — Z5111 Encounter for antineoplastic chemotherapy: Secondary | ICD-10-CM | POA: Diagnosis not present

## 2021-05-15 DIAGNOSIS — G629 Polyneuropathy, unspecified: Secondary | ICD-10-CM | POA: Diagnosis not present

## 2021-05-15 DIAGNOSIS — E785 Hyperlipidemia, unspecified: Secondary | ICD-10-CM | POA: Diagnosis not present

## 2021-05-15 DIAGNOSIS — C8338 Diffuse large B-cell lymphoma, lymph nodes of multiple sites: Secondary | ICD-10-CM | POA: Diagnosis not present

## 2021-05-15 DIAGNOSIS — C8518 Unspecified B-cell lymphoma, lymph nodes of multiple sites: Secondary | ICD-10-CM | POA: Diagnosis not present

## 2021-05-15 DIAGNOSIS — Z5112 Encounter for antineoplastic immunotherapy: Secondary | ICD-10-CM | POA: Diagnosis not present

## 2021-05-15 DIAGNOSIS — C8590 Non-Hodgkin lymphoma, unspecified, unspecified site: Secondary | ICD-10-CM | POA: Diagnosis not present

## 2021-05-22 DIAGNOSIS — C8518 Unspecified B-cell lymphoma, lymph nodes of multiple sites: Secondary | ICD-10-CM | POA: Diagnosis not present

## 2021-05-22 DIAGNOSIS — C8338 Diffuse large B-cell lymphoma, lymph nodes of multiple sites: Secondary | ICD-10-CM | POA: Diagnosis not present

## 2021-05-22 DIAGNOSIS — C8590 Non-Hodgkin lymphoma, unspecified, unspecified site: Secondary | ICD-10-CM | POA: Diagnosis not present

## 2021-05-28 DIAGNOSIS — Z03818 Encounter for observation for suspected exposure to other biological agents ruled out: Secondary | ICD-10-CM | POA: Diagnosis not present

## 2021-05-28 DIAGNOSIS — Z20822 Contact with and (suspected) exposure to covid-19: Secondary | ICD-10-CM | POA: Diagnosis not present

## 2021-05-29 DIAGNOSIS — C8338 Diffuse large B-cell lymphoma, lymph nodes of multiple sites: Secondary | ICD-10-CM | POA: Diagnosis not present

## 2021-05-29 DIAGNOSIS — Z79899 Other long term (current) drug therapy: Secondary | ICD-10-CM | POA: Diagnosis not present

## 2021-05-30 DIAGNOSIS — Z8616 Personal history of COVID-19: Secondary | ICD-10-CM | POA: Diagnosis not present

## 2021-05-30 DIAGNOSIS — C8330 Diffuse large B-cell lymphoma, unspecified site: Secondary | ICD-10-CM | POA: Diagnosis not present

## 2021-06-03 DIAGNOSIS — R9431 Abnormal electrocardiogram [ECG] [EKG]: Secondary | ICD-10-CM | POA: Diagnosis not present

## 2021-06-03 DIAGNOSIS — I10 Essential (primary) hypertension: Secondary | ICD-10-CM | POA: Diagnosis not present

## 2021-06-03 DIAGNOSIS — I313 Pericardial effusion (noninflammatory): Secondary | ICD-10-CM | POA: Diagnosis not present

## 2021-06-03 DIAGNOSIS — I348 Other nonrheumatic mitral valve disorders: Secondary | ICD-10-CM | POA: Diagnosis not present

## 2021-06-03 DIAGNOSIS — I517 Cardiomegaly: Secondary | ICD-10-CM | POA: Diagnosis not present

## 2021-06-03 DIAGNOSIS — C8338 Diffuse large B-cell lymphoma, lymph nodes of multiple sites: Secondary | ICD-10-CM | POA: Diagnosis not present

## 2021-06-06 DIAGNOSIS — E785 Hyperlipidemia, unspecified: Secondary | ICD-10-CM | POA: Diagnosis not present

## 2021-06-06 DIAGNOSIS — D630 Anemia in neoplastic disease: Secondary | ICD-10-CM | POA: Diagnosis not present

## 2021-06-06 DIAGNOSIS — Z114 Encounter for screening for human immunodeficiency virus [HIV]: Secondary | ICD-10-CM | POA: Diagnosis not present

## 2021-06-06 DIAGNOSIS — I10 Essential (primary) hypertension: Secondary | ICD-10-CM | POA: Diagnosis not present

## 2021-06-06 DIAGNOSIS — C8518 Unspecified B-cell lymphoma, lymph nodes of multiple sites: Secondary | ICD-10-CM | POA: Insufficient documentation

## 2021-06-06 DIAGNOSIS — Z1159 Encounter for screening for other viral diseases: Secondary | ICD-10-CM | POA: Diagnosis not present

## 2021-06-06 DIAGNOSIS — G629 Polyneuropathy, unspecified: Secondary | ICD-10-CM | POA: Diagnosis not present

## 2021-06-06 DIAGNOSIS — D759 Disease of blood and blood-forming organs, unspecified: Secondary | ICD-10-CM | POA: Diagnosis not present

## 2021-06-06 DIAGNOSIS — C8338 Diffuse large B-cell lymphoma, lymph nodes of multiple sites: Secondary | ICD-10-CM | POA: Diagnosis not present

## 2021-06-06 DIAGNOSIS — Z8616 Personal history of COVID-19: Secondary | ICD-10-CM | POA: Diagnosis not present

## 2021-06-07 DIAGNOSIS — Z452 Encounter for adjustment and management of vascular access device: Secondary | ICD-10-CM | POA: Diagnosis not present

## 2021-06-07 DIAGNOSIS — C8300 Small cell B-cell lymphoma, unspecified site: Secondary | ICD-10-CM | POA: Diagnosis not present

## 2021-06-07 DIAGNOSIS — C8338 Diffuse large B-cell lymphoma, lymph nodes of multiple sites: Secondary | ICD-10-CM | POA: Diagnosis not present

## 2021-06-10 DIAGNOSIS — Z5112 Encounter for antineoplastic immunotherapy: Secondary | ICD-10-CM | POA: Diagnosis not present

## 2021-06-10 DIAGNOSIS — C8338 Diffuse large B-cell lymphoma, lymph nodes of multiple sites: Secondary | ICD-10-CM | POA: Diagnosis not present

## 2021-06-10 DIAGNOSIS — Z7689 Persons encountering health services in other specified circumstances: Secondary | ICD-10-CM | POA: Diagnosis not present

## 2021-07-03 DIAGNOSIS — E559 Vitamin D deficiency, unspecified: Secondary | ICD-10-CM | POA: Diagnosis not present

## 2021-07-03 DIAGNOSIS — Z23 Encounter for immunization: Secondary | ICD-10-CM | POA: Diagnosis not present

## 2021-07-03 DIAGNOSIS — C8590 Non-Hodgkin lymphoma, unspecified, unspecified site: Secondary | ICD-10-CM | POA: Diagnosis not present

## 2021-07-03 DIAGNOSIS — C8338 Diffuse large B-cell lymphoma, lymph nodes of multiple sites: Secondary | ICD-10-CM | POA: Diagnosis not present

## 2021-07-03 DIAGNOSIS — Z7689 Persons encountering health services in other specified circumstances: Secondary | ICD-10-CM | POA: Diagnosis not present

## 2021-07-03 DIAGNOSIS — C8518 Unspecified B-cell lymphoma, lymph nodes of multiple sites: Secondary | ICD-10-CM | POA: Diagnosis not present

## 2021-07-03 DIAGNOSIS — Z5112 Encounter for antineoplastic immunotherapy: Secondary | ICD-10-CM | POA: Diagnosis not present

## 2021-07-25 DIAGNOSIS — E785 Hyperlipidemia, unspecified: Secondary | ICD-10-CM | POA: Diagnosis not present

## 2021-07-25 DIAGNOSIS — R599 Enlarged lymph nodes, unspecified: Secondary | ICD-10-CM | POA: Diagnosis not present

## 2021-07-25 DIAGNOSIS — I10 Essential (primary) hypertension: Secondary | ICD-10-CM | POA: Diagnosis not present

## 2021-07-25 DIAGNOSIS — C8338 Diffuse large B-cell lymphoma, lymph nodes of multiple sites: Secondary | ICD-10-CM | POA: Diagnosis not present

## 2021-07-25 DIAGNOSIS — K769 Liver disease, unspecified: Secondary | ICD-10-CM | POA: Diagnosis not present

## 2021-07-29 DIAGNOSIS — I10 Essential (primary) hypertension: Secondary | ICD-10-CM | POA: Diagnosis not present

## 2021-07-29 DIAGNOSIS — E785 Hyperlipidemia, unspecified: Secondary | ICD-10-CM | POA: Diagnosis not present

## 2021-07-29 DIAGNOSIS — E883 Tumor lysis syndrome: Secondary | ICD-10-CM | POA: Diagnosis not present

## 2021-07-29 DIAGNOSIS — C8338 Diffuse large B-cell lymphoma, lymph nodes of multiple sites: Secondary | ICD-10-CM | POA: Diagnosis not present

## 2021-07-31 DIAGNOSIS — C8338 Diffuse large B-cell lymphoma, lymph nodes of multiple sites: Secondary | ICD-10-CM | POA: Diagnosis not present

## 2021-07-31 DIAGNOSIS — C9592 Leukemia, unspecified, in relapse: Secondary | ICD-10-CM | POA: Diagnosis not present

## 2021-07-31 DIAGNOSIS — E785 Hyperlipidemia, unspecified: Secondary | ICD-10-CM | POA: Diagnosis not present

## 2021-07-31 DIAGNOSIS — I10 Essential (primary) hypertension: Secondary | ICD-10-CM | POA: Diagnosis not present

## 2021-07-31 DIAGNOSIS — C8518 Unspecified B-cell lymphoma, lymph nodes of multiple sites: Secondary | ICD-10-CM | POA: Diagnosis not present

## 2021-07-31 DIAGNOSIS — C8590 Non-Hodgkin lymphoma, unspecified, unspecified site: Secondary | ICD-10-CM | POA: Diagnosis not present

## 2021-07-31 DIAGNOSIS — Z5111 Encounter for antineoplastic chemotherapy: Secondary | ICD-10-CM | POA: Diagnosis not present

## 2021-08-01 DIAGNOSIS — C8338 Diffuse large B-cell lymphoma, lymph nodes of multiple sites: Secondary | ICD-10-CM | POA: Diagnosis not present

## 2021-08-01 DIAGNOSIS — Z5111 Encounter for antineoplastic chemotherapy: Secondary | ICD-10-CM | POA: Diagnosis not present

## 2021-08-02 DIAGNOSIS — Z5111 Encounter for antineoplastic chemotherapy: Secondary | ICD-10-CM | POA: Diagnosis not present

## 2021-08-02 DIAGNOSIS — C8338 Diffuse large B-cell lymphoma, lymph nodes of multiple sites: Secondary | ICD-10-CM | POA: Diagnosis not present

## 2021-08-04 DIAGNOSIS — Z79899 Other long term (current) drug therapy: Secondary | ICD-10-CM | POA: Diagnosis not present

## 2021-08-04 DIAGNOSIS — C8338 Diffuse large B-cell lymphoma, lymph nodes of multiple sites: Secondary | ICD-10-CM | POA: Diagnosis not present

## 2021-08-07 DIAGNOSIS — C8338 Diffuse large B-cell lymphoma, lymph nodes of multiple sites: Secondary | ICD-10-CM | POA: Diagnosis not present

## 2021-08-07 DIAGNOSIS — I10 Essential (primary) hypertension: Secondary | ICD-10-CM | POA: Diagnosis not present

## 2021-08-07 DIAGNOSIS — E785 Hyperlipidemia, unspecified: Secondary | ICD-10-CM | POA: Diagnosis not present

## 2021-08-09 DIAGNOSIS — I10 Essential (primary) hypertension: Secondary | ICD-10-CM | POA: Diagnosis not present

## 2021-08-09 DIAGNOSIS — Z79899 Other long term (current) drug therapy: Secondary | ICD-10-CM | POA: Diagnosis not present

## 2021-08-09 DIAGNOSIS — E876 Hypokalemia: Secondary | ICD-10-CM | POA: Diagnosis not present

## 2021-08-09 DIAGNOSIS — R110 Nausea: Secondary | ICD-10-CM | POA: Diagnosis not present

## 2021-08-09 DIAGNOSIS — G629 Polyneuropathy, unspecified: Secondary | ICD-10-CM | POA: Diagnosis not present

## 2021-08-09 DIAGNOSIS — C8338 Diffuse large B-cell lymphoma, lymph nodes of multiple sites: Secondary | ICD-10-CM | POA: Diagnosis not present

## 2021-08-09 DIAGNOSIS — E785 Hyperlipidemia, unspecified: Secondary | ICD-10-CM | POA: Diagnosis not present

## 2021-08-12 DIAGNOSIS — Z79899 Other long term (current) drug therapy: Secondary | ICD-10-CM | POA: Diagnosis not present

## 2021-08-12 DIAGNOSIS — C8338 Diffuse large B-cell lymphoma, lymph nodes of multiple sites: Secondary | ICD-10-CM | POA: Diagnosis not present

## 2021-08-12 DIAGNOSIS — C9112 Chronic lymphocytic leukemia of B-cell type in relapse: Secondary | ICD-10-CM | POA: Diagnosis not present

## 2021-08-12 DIAGNOSIS — E785 Hyperlipidemia, unspecified: Secondary | ICD-10-CM | POA: Diagnosis not present

## 2021-08-12 DIAGNOSIS — I10 Essential (primary) hypertension: Secondary | ICD-10-CM | POA: Diagnosis not present

## 2021-08-14 DIAGNOSIS — C8338 Diffuse large B-cell lymphoma, lymph nodes of multiple sites: Secondary | ICD-10-CM | POA: Diagnosis not present

## 2021-08-14 DIAGNOSIS — K219 Gastro-esophageal reflux disease without esophagitis: Secondary | ICD-10-CM | POA: Diagnosis not present

## 2021-08-14 DIAGNOSIS — Z79899 Other long term (current) drug therapy: Secondary | ICD-10-CM | POA: Diagnosis not present

## 2021-08-14 DIAGNOSIS — R21 Rash and other nonspecific skin eruption: Secondary | ICD-10-CM | POA: Diagnosis not present

## 2021-08-14 DIAGNOSIS — Z8616 Personal history of COVID-19: Secondary | ICD-10-CM | POA: Diagnosis not present

## 2021-08-14 DIAGNOSIS — I10 Essential (primary) hypertension: Secondary | ICD-10-CM | POA: Diagnosis not present

## 2021-08-14 DIAGNOSIS — E785 Hyperlipidemia, unspecified: Secondary | ICD-10-CM | POA: Diagnosis not present

## 2021-08-14 DIAGNOSIS — Z9221 Personal history of antineoplastic chemotherapy: Secondary | ICD-10-CM | POA: Diagnosis not present

## 2021-08-19 DIAGNOSIS — E785 Hyperlipidemia, unspecified: Secondary | ICD-10-CM | POA: Diagnosis not present

## 2021-08-19 DIAGNOSIS — Z9221 Personal history of antineoplastic chemotherapy: Secondary | ICD-10-CM | POA: Diagnosis not present

## 2021-08-19 DIAGNOSIS — R42 Dizziness and giddiness: Secondary | ICD-10-CM | POA: Diagnosis not present

## 2021-08-19 DIAGNOSIS — Z79899 Other long term (current) drug therapy: Secondary | ICD-10-CM | POA: Diagnosis not present

## 2021-08-19 DIAGNOSIS — I10 Essential (primary) hypertension: Secondary | ICD-10-CM | POA: Diagnosis not present

## 2021-08-19 DIAGNOSIS — T451X5A Adverse effect of antineoplastic and immunosuppressive drugs, initial encounter: Secondary | ICD-10-CM | POA: Diagnosis not present

## 2021-08-19 DIAGNOSIS — C8338 Diffuse large B-cell lymphoma, lymph nodes of multiple sites: Secondary | ICD-10-CM | POA: Diagnosis not present

## 2021-08-22 DIAGNOSIS — C8518 Unspecified B-cell lymphoma, lymph nodes of multiple sites: Secondary | ICD-10-CM | POA: Diagnosis not present

## 2021-08-22 DIAGNOSIS — D696 Thrombocytopenia, unspecified: Secondary | ICD-10-CM | POA: Diagnosis not present

## 2021-08-22 DIAGNOSIS — C8338 Diffuse large B-cell lymphoma, lymph nodes of multiple sites: Secondary | ICD-10-CM | POA: Diagnosis not present

## 2021-08-22 DIAGNOSIS — D709 Neutropenia, unspecified: Secondary | ICD-10-CM | POA: Diagnosis not present

## 2021-08-22 DIAGNOSIS — D703 Neutropenia due to infection: Secondary | ICD-10-CM | POA: Diagnosis not present

## 2021-08-22 DIAGNOSIS — R7881 Bacteremia: Secondary | ICD-10-CM | POA: Diagnosis not present

## 2021-08-22 DIAGNOSIS — Z6823 Body mass index (BMI) 23.0-23.9, adult: Secondary | ICD-10-CM | POA: Diagnosis not present

## 2021-08-22 DIAGNOSIS — C8590 Non-Hodgkin lymphoma, unspecified, unspecified site: Secondary | ICD-10-CM | POA: Diagnosis not present

## 2021-08-22 DIAGNOSIS — Z20822 Contact with and (suspected) exposure to covid-19: Secondary | ICD-10-CM | POA: Diagnosis not present

## 2021-08-22 DIAGNOSIS — C9110 Chronic lymphocytic leukemia of B-cell type not having achieved remission: Secondary | ICD-10-CM | POA: Diagnosis not present

## 2021-08-22 DIAGNOSIS — R5081 Fever presenting with conditions classified elsewhere: Secondary | ICD-10-CM | POA: Diagnosis not present

## 2021-08-22 DIAGNOSIS — R42 Dizziness and giddiness: Secondary | ICD-10-CM | POA: Diagnosis not present

## 2021-08-22 DIAGNOSIS — I10 Essential (primary) hypertension: Secondary | ICD-10-CM | POA: Diagnosis not present

## 2021-08-22 DIAGNOSIS — B9689 Other specified bacterial agents as the cause of diseases classified elsewhere: Secondary | ICD-10-CM | POA: Diagnosis not present

## 2021-08-22 DIAGNOSIS — Z6825 Body mass index (BMI) 25.0-25.9, adult: Secondary | ICD-10-CM | POA: Diagnosis not present

## 2021-08-22 DIAGNOSIS — C9112 Chronic lymphocytic leukemia of B-cell type in relapse: Secondary | ICD-10-CM | POA: Diagnosis not present

## 2021-08-22 DIAGNOSIS — Z006 Encounter for examination for normal comparison and control in clinical research program: Secondary | ICD-10-CM | POA: Diagnosis not present

## 2021-08-22 DIAGNOSIS — C8510 Unspecified B-cell lymphoma, unspecified site: Secondary | ICD-10-CM | POA: Diagnosis not present

## 2021-08-22 DIAGNOSIS — D899 Disorder involving the immune mechanism, unspecified: Secondary | ICD-10-CM | POA: Diagnosis not present

## 2021-08-22 DIAGNOSIS — Z9221 Personal history of antineoplastic chemotherapy: Secondary | ICD-10-CM | POA: Diagnosis not present

## 2021-08-22 DIAGNOSIS — C8330 Diffuse large B-cell lymphoma, unspecified site: Secondary | ICD-10-CM | POA: Diagnosis not present

## 2021-08-22 DIAGNOSIS — R509 Fever, unspecified: Secondary | ICD-10-CM | POA: Diagnosis not present

## 2021-08-22 DIAGNOSIS — E440 Moderate protein-calorie malnutrition: Secondary | ICD-10-CM | POA: Diagnosis not present

## 2021-08-22 DIAGNOSIS — D84821 Immunodeficiency due to drugs: Secondary | ICD-10-CM | POA: Diagnosis not present

## 2021-08-22 DIAGNOSIS — Z9484 Stem cells transplant status: Secondary | ICD-10-CM | POA: Diagnosis not present

## 2021-08-25 DIAGNOSIS — D709 Neutropenia, unspecified: Secondary | ICD-10-CM | POA: Diagnosis not present

## 2021-08-25 DIAGNOSIS — R42 Dizziness and giddiness: Secondary | ICD-10-CM | POA: Diagnosis not present

## 2021-08-25 DIAGNOSIS — C8338 Diffuse large B-cell lymphoma, lymph nodes of multiple sites: Secondary | ICD-10-CM | POA: Diagnosis not present

## 2021-08-25 DIAGNOSIS — C9112 Chronic lymphocytic leukemia of B-cell type in relapse: Secondary | ICD-10-CM | POA: Diagnosis not present

## 2021-08-25 DIAGNOSIS — Z9484 Stem cells transplant status: Secondary | ICD-10-CM | POA: Diagnosis not present

## 2021-08-25 DIAGNOSIS — Z006 Encounter for examination for normal comparison and control in clinical research program: Secondary | ICD-10-CM | POA: Diagnosis not present

## 2021-08-25 DIAGNOSIS — B9689 Other specified bacterial agents as the cause of diseases classified elsewhere: Secondary | ICD-10-CM | POA: Diagnosis not present

## 2021-08-25 DIAGNOSIS — C8518 Unspecified B-cell lymphoma, lymph nodes of multiple sites: Secondary | ICD-10-CM | POA: Diagnosis not present

## 2021-08-25 DIAGNOSIS — C8330 Diffuse large B-cell lymphoma, unspecified site: Secondary | ICD-10-CM | POA: Diagnosis not present

## 2021-08-25 DIAGNOSIS — E440 Moderate protein-calorie malnutrition: Secondary | ICD-10-CM | POA: Diagnosis not present

## 2021-08-25 DIAGNOSIS — Z6823 Body mass index (BMI) 23.0-23.9, adult: Secondary | ICD-10-CM | POA: Diagnosis not present

## 2021-08-25 DIAGNOSIS — I10 Essential (primary) hypertension: Secondary | ICD-10-CM | POA: Diagnosis not present

## 2021-08-25 DIAGNOSIS — D899 Disorder involving the immune mechanism, unspecified: Secondary | ICD-10-CM | POA: Diagnosis not present

## 2021-08-25 DIAGNOSIS — Z20822 Contact with and (suspected) exposure to covid-19: Secondary | ICD-10-CM | POA: Diagnosis not present

## 2021-08-25 DIAGNOSIS — R7881 Bacteremia: Secondary | ICD-10-CM | POA: Diagnosis not present

## 2021-08-25 DIAGNOSIS — C8590 Non-Hodgkin lymphoma, unspecified, unspecified site: Secondary | ICD-10-CM | POA: Diagnosis not present

## 2021-08-25 DIAGNOSIS — Z6825 Body mass index (BMI) 25.0-25.9, adult: Secondary | ICD-10-CM | POA: Diagnosis not present

## 2021-08-25 DIAGNOSIS — D84821 Immunodeficiency due to drugs: Secondary | ICD-10-CM | POA: Diagnosis not present

## 2021-08-25 DIAGNOSIS — C9110 Chronic lymphocytic leukemia of B-cell type not having achieved remission: Secondary | ICD-10-CM | POA: Diagnosis not present

## 2021-08-25 DIAGNOSIS — D696 Thrombocytopenia, unspecified: Secondary | ICD-10-CM | POA: Diagnosis not present

## 2021-08-25 DIAGNOSIS — R509 Fever, unspecified: Secondary | ICD-10-CM | POA: Diagnosis not present

## 2021-08-25 DIAGNOSIS — D703 Neutropenia due to infection: Secondary | ICD-10-CM | POA: Diagnosis not present

## 2021-08-25 DIAGNOSIS — R5081 Fever presenting with conditions classified elsewhere: Secondary | ICD-10-CM | POA: Diagnosis not present

## 2021-08-25 DIAGNOSIS — Z9221 Personal history of antineoplastic chemotherapy: Secondary | ICD-10-CM | POA: Diagnosis not present

## 2021-09-05 DIAGNOSIS — Z9221 Personal history of antineoplastic chemotherapy: Secondary | ICD-10-CM | POA: Diagnosis not present

## 2021-09-05 DIAGNOSIS — R5081 Fever presenting with conditions classified elsewhere: Secondary | ICD-10-CM | POA: Diagnosis not present

## 2021-09-05 DIAGNOSIS — B9689 Other specified bacterial agents as the cause of diseases classified elsewhere: Secondary | ICD-10-CM | POA: Diagnosis not present

## 2021-09-05 DIAGNOSIS — C8338 Diffuse large B-cell lymphoma, lymph nodes of multiple sites: Secondary | ICD-10-CM | POA: Diagnosis not present

## 2021-09-05 DIAGNOSIS — D696 Thrombocytopenia, unspecified: Secondary | ICD-10-CM | POA: Diagnosis not present

## 2021-09-05 DIAGNOSIS — R197 Diarrhea, unspecified: Secondary | ICD-10-CM | POA: Diagnosis not present

## 2021-09-05 DIAGNOSIS — K922 Gastrointestinal hemorrhage, unspecified: Secondary | ICD-10-CM | POA: Diagnosis not present

## 2021-09-05 DIAGNOSIS — R509 Fever, unspecified: Secondary | ICD-10-CM | POA: Diagnosis not present

## 2021-09-05 DIAGNOSIS — R918 Other nonspecific abnormal finding of lung field: Secondary | ICD-10-CM | POA: Diagnosis not present

## 2021-09-05 DIAGNOSIS — J90 Pleural effusion, not elsewhere classified: Secondary | ICD-10-CM | POA: Diagnosis not present

## 2021-09-05 DIAGNOSIS — C8330 Diffuse large B-cell lymphoma, unspecified site: Secondary | ICD-10-CM | POA: Diagnosis not present

## 2021-09-05 DIAGNOSIS — Z9481 Bone marrow transplant status: Secondary | ICD-10-CM | POA: Diagnosis not present

## 2021-09-05 DIAGNOSIS — D630 Anemia in neoplastic disease: Secondary | ICD-10-CM | POA: Diagnosis not present

## 2021-09-05 DIAGNOSIS — D61818 Other pancytopenia: Secondary | ICD-10-CM | POA: Diagnosis not present

## 2021-09-05 DIAGNOSIS — C9110 Chronic lymphocytic leukemia of B-cell type not having achieved remission: Secondary | ICD-10-CM | POA: Diagnosis not present

## 2021-09-05 DIAGNOSIS — Z9484 Stem cells transplant status: Secondary | ICD-10-CM | POA: Diagnosis not present

## 2021-09-05 DIAGNOSIS — C8590 Non-Hodgkin lymphoma, unspecified, unspecified site: Secondary | ICD-10-CM | POA: Diagnosis not present

## 2021-09-05 DIAGNOSIS — E871 Hypo-osmolality and hyponatremia: Secondary | ICD-10-CM | POA: Diagnosis not present

## 2021-09-05 DIAGNOSIS — N179 Acute kidney failure, unspecified: Secondary | ICD-10-CM | POA: Diagnosis not present

## 2021-09-05 DIAGNOSIS — Z20822 Contact with and (suspected) exposure to covid-19: Secondary | ICD-10-CM | POA: Diagnosis not present

## 2021-09-05 DIAGNOSIS — R7881 Bacteremia: Secondary | ICD-10-CM | POA: Diagnosis not present

## 2021-09-05 DIAGNOSIS — E441 Mild protein-calorie malnutrition: Secondary | ICD-10-CM | POA: Diagnosis not present

## 2021-09-05 DIAGNOSIS — B370 Candidal stomatitis: Secondary | ICD-10-CM | POA: Diagnosis not present

## 2021-09-05 DIAGNOSIS — I071 Rheumatic tricuspid insufficiency: Secondary | ICD-10-CM | POA: Diagnosis not present

## 2021-09-05 DIAGNOSIS — R319 Hematuria, unspecified: Secondary | ICD-10-CM | POA: Diagnosis not present

## 2021-09-05 DIAGNOSIS — A498 Other bacterial infections of unspecified site: Secondary | ICD-10-CM | POA: Diagnosis not present

## 2021-09-05 DIAGNOSIS — R9389 Abnormal findings on diagnostic imaging of other specified body structures: Secondary | ICD-10-CM | POA: Diagnosis not present

## 2021-09-05 DIAGNOSIS — B488 Other specified mycoses: Secondary | ICD-10-CM | POA: Diagnosis not present

## 2021-09-05 DIAGNOSIS — D709 Neutropenia, unspecified: Secondary | ICD-10-CM | POA: Diagnosis not present

## 2021-09-13 DIAGNOSIS — C8518 Unspecified B-cell lymphoma, lymph nodes of multiple sites: Secondary | ICD-10-CM | POA: Diagnosis not present

## 2021-09-16 ENCOUNTER — Other Ambulatory Visit: Payer: Self-pay

## 2021-09-16 DIAGNOSIS — C8330 Diffuse large B-cell lymphoma, unspecified site: Secondary | ICD-10-CM | POA: Diagnosis not present

## 2021-09-16 DIAGNOSIS — D709 Neutropenia, unspecified: Secondary | ICD-10-CM | POA: Insufficient documentation

## 2021-09-16 DIAGNOSIS — B49 Unspecified mycosis: Secondary | ICD-10-CM | POA: Diagnosis not present

## 2021-09-16 DIAGNOSIS — D708 Other neutropenia: Secondary | ICD-10-CM | POA: Diagnosis not present

## 2021-09-16 DIAGNOSIS — Z6825 Body mass index (BMI) 25.0-25.9, adult: Secondary | ICD-10-CM | POA: Diagnosis not present

## 2021-09-16 DIAGNOSIS — R197 Diarrhea, unspecified: Secondary | ICD-10-CM | POA: Diagnosis not present

## 2021-09-16 DIAGNOSIS — J168 Pneumonia due to other specified infectious organisms: Secondary | ICD-10-CM | POA: Diagnosis not present

## 2021-09-16 DIAGNOSIS — D696 Thrombocytopenia, unspecified: Secondary | ICD-10-CM | POA: Diagnosis not present

## 2021-09-16 NOTE — Patient Outreach (Signed)
[place] [facility]) Care Management  09/16/2021  Mercedes Vasquez May 18, 1946 [id number]     Transition of Care Referral  Referral Date: 09/16/2021 Referral Source: Discharge Report Date of Discharge: 09/11/2021 Facility: [facility]    Outreach attempt # 1 to patient. No answer after several rings and unable to leave message.    Plan: RN CM will make outreach attempt within 3-4 business days. RN CM will send unsuccessful outreach letter to patient.    Enzo Montgomery, RN,BSN,CCM [place] Management Telephonic Care Management Coordinator Direct Phone: [phone] Toll Free: [phone] Fax: [phone]

## 2021-09-17 ENCOUNTER — Other Ambulatory Visit: Payer: Self-pay

## 2021-09-17 NOTE — Patient Outreach (Addendum)
[place] [facility]) Care Management  09/17/2021  Mercedes Vasquez [birth year] [id number]   Transition of Care Referral   Referral Date: 09/16/2021 Referral Source: Discharge Report Date of Discharge: 09/11/2021 Facility: [facility]   Unsuccessful outreach attempt #2 to patient.     Plan: RN CM will make outreach attempt within 3-4 business days.    Enzo Montgomery, RN,BSN,CCM [place] Management Telephonic Care Management Coordinator Direct Phone: [phone] Toll Free: [phone] Fax: [phone]

## 2021-09-18 ENCOUNTER — Other Ambulatory Visit: Payer: Self-pay

## 2021-09-18 NOTE — Patient Outreach (Signed)
[place] [facility]) Care Management  09/18/2021  Mercedes Vasquez October 06, 1946 [id number]   Transition of Care Referral   Referral Date: 09/16/2021 Referral Source: Discharge Report Date of Discharge: 09/11/2021 Facility: [facility]    Unsuccessful outreach attempt to patient-no answer.    Plan: RN CM will make outreach attempt to patient within 3-4 wks if no response from letter mailed to patient.   Enzo Montgomery, RN,BSN,CCM [place] Management Telephonic Care Management Coordinator Direct Phone: [phone] Toll Free: [phone] Fax: [phone]

## 2021-09-19 DIAGNOSIS — C8330 Diffuse large B-cell lymphoma, unspecified site: Secondary | ICD-10-CM | POA: Diagnosis not present

## 2021-09-19 DIAGNOSIS — Z92850 Personal history of chimeric antigen receptor t-cell therapy: Secondary | ICD-10-CM | POA: Diagnosis not present

## 2021-09-19 DIAGNOSIS — D709 Neutropenia, unspecified: Secondary | ICD-10-CM | POA: Diagnosis not present

## 2021-09-19 DIAGNOSIS — Z09 Encounter for follow-up examination after completed treatment for conditions other than malignant neoplasm: Secondary | ICD-10-CM | POA: Diagnosis not present

## 2021-09-19 DIAGNOSIS — Z6825 Body mass index (BMI) 25.0-25.9, adult: Secondary | ICD-10-CM | POA: Diagnosis not present

## 2021-09-19 DIAGNOSIS — G629 Polyneuropathy, unspecified: Secondary | ICD-10-CM | POA: Diagnosis not present

## 2021-09-19 DIAGNOSIS — Z9221 Personal history of antineoplastic chemotherapy: Secondary | ICD-10-CM | POA: Diagnosis not present

## 2021-09-19 DIAGNOSIS — K1120 Sialoadenitis, unspecified: Secondary | ICD-10-CM | POA: Diagnosis not present

## 2021-09-19 DIAGNOSIS — D696 Thrombocytopenia, unspecified: Secondary | ICD-10-CM | POA: Diagnosis not present

## 2021-09-19 DIAGNOSIS — C8338 Diffuse large B-cell lymphoma, lymph nodes of multiple sites: Secondary | ICD-10-CM | POA: Diagnosis not present

## 2021-09-19 DIAGNOSIS — D61818 Other pancytopenia: Secondary | ICD-10-CM | POA: Diagnosis not present

## 2021-09-19 DIAGNOSIS — R7881 Bacteremia: Secondary | ICD-10-CM | POA: Diagnosis not present

## 2021-09-22 DIAGNOSIS — C8518 Unspecified B-cell lymphoma, lymph nodes of multiple sites: Secondary | ICD-10-CM | POA: Diagnosis not present

## 2021-09-25 DIAGNOSIS — Z92850 Personal history of chimeric antigen receptor t-cell therapy: Secondary | ICD-10-CM | POA: Diagnosis not present

## 2021-09-25 DIAGNOSIS — C8338 Diffuse large B-cell lymphoma, lymph nodes of multiple sites: Secondary | ICD-10-CM | POA: Diagnosis not present

## 2021-09-26 DIAGNOSIS — C8338 Diffuse large B-cell lymphoma, lymph nodes of multiple sites: Secondary | ICD-10-CM | POA: Diagnosis not present

## 2021-09-26 DIAGNOSIS — R918 Other nonspecific abnormal finding of lung field: Secondary | ICD-10-CM | POA: Diagnosis not present

## 2021-09-28 DIAGNOSIS — C8518 Unspecified B-cell lymphoma, lymph nodes of multiple sites: Secondary | ICD-10-CM | POA: Diagnosis not present

## 2021-09-30 DIAGNOSIS — C8338 Diffuse large B-cell lymphoma, lymph nodes of multiple sites: Secondary | ICD-10-CM | POA: Diagnosis not present

## 2021-10-02 DIAGNOSIS — R197 Diarrhea, unspecified: Secondary | ICD-10-CM | POA: Diagnosis not present

## 2021-10-02 DIAGNOSIS — R591 Generalized enlarged lymph nodes: Secondary | ICD-10-CM | POA: Diagnosis not present

## 2021-10-02 DIAGNOSIS — Z8719 Personal history of other diseases of the digestive system: Secondary | ICD-10-CM | POA: Diagnosis not present

## 2021-10-02 DIAGNOSIS — Z6827 Body mass index (BMI) 27.0-27.9, adult: Secondary | ICD-10-CM | POA: Diagnosis not present

## 2021-10-02 DIAGNOSIS — J90 Pleural effusion, not elsewhere classified: Secondary | ICD-10-CM | POA: Diagnosis not present

## 2021-10-02 DIAGNOSIS — D649 Anemia, unspecified: Secondary | ICD-10-CM | POA: Diagnosis not present

## 2021-10-02 DIAGNOSIS — J17 Pneumonia in diseases classified elsewhere: Secondary | ICD-10-CM | POA: Diagnosis not present

## 2021-10-02 DIAGNOSIS — R0902 Hypoxemia: Secondary | ICD-10-CM | POA: Diagnosis not present

## 2021-10-02 DIAGNOSIS — K5900 Constipation, unspecified: Secondary | ICD-10-CM | POA: Diagnosis not present

## 2021-10-02 DIAGNOSIS — D630 Anemia in neoplastic disease: Secondary | ICD-10-CM | POA: Diagnosis not present

## 2021-10-02 DIAGNOSIS — C8590 Non-Hodgkin lymphoma, unspecified, unspecified site: Secondary | ICD-10-CM | POA: Diagnosis not present

## 2021-10-02 DIAGNOSIS — M7989 Other specified soft tissue disorders: Secondary | ICD-10-CM | POA: Diagnosis not present

## 2021-10-02 DIAGNOSIS — D61818 Other pancytopenia: Secondary | ICD-10-CM | POA: Diagnosis not present

## 2021-10-02 DIAGNOSIS — K922 Gastrointestinal hemorrhage, unspecified: Secondary | ICD-10-CM | POA: Diagnosis not present

## 2021-10-02 DIAGNOSIS — D709 Neutropenia, unspecified: Secondary | ICD-10-CM | POA: Diagnosis not present

## 2021-10-02 DIAGNOSIS — R4182 Altered mental status, unspecified: Secondary | ICD-10-CM | POA: Diagnosis not present

## 2021-10-02 DIAGNOSIS — K921 Melena: Secondary | ICD-10-CM | POA: Diagnosis not present

## 2021-10-02 DIAGNOSIS — J9811 Atelectasis: Secondary | ICD-10-CM | POA: Diagnosis not present

## 2021-10-02 DIAGNOSIS — T80211A Bloodstream infection due to central venous catheter, initial encounter: Secondary | ICD-10-CM | POA: Diagnosis not present

## 2021-10-02 DIAGNOSIS — Z9221 Personal history of antineoplastic chemotherapy: Secondary | ICD-10-CM | POA: Diagnosis not present

## 2021-10-02 DIAGNOSIS — B954 Other streptococcus as the cause of diseases classified elsewhere: Secondary | ICD-10-CM | POA: Diagnosis not present

## 2021-10-02 DIAGNOSIS — Z452 Encounter for adjustment and management of vascular access device: Secondary | ICD-10-CM | POA: Diagnosis not present

## 2021-10-02 DIAGNOSIS — Z5111 Encounter for antineoplastic chemotherapy: Secondary | ICD-10-CM | POA: Diagnosis not present

## 2021-10-02 DIAGNOSIS — B955 Unspecified streptococcus as the cause of diseases classified elsewhere: Secondary | ICD-10-CM | POA: Diagnosis not present

## 2021-10-02 DIAGNOSIS — Z79620 Long term (current) use of immunosuppressive biologic: Secondary | ICD-10-CM | POA: Diagnosis not present

## 2021-10-02 DIAGNOSIS — R600 Localized edema: Secondary | ICD-10-CM | POA: Diagnosis not present

## 2021-10-02 DIAGNOSIS — R110 Nausea: Secondary | ICD-10-CM | POA: Diagnosis not present

## 2021-10-02 DIAGNOSIS — J329 Chronic sinusitis, unspecified: Secondary | ICD-10-CM | POA: Diagnosis not present

## 2021-10-02 DIAGNOSIS — R5383 Other fatigue: Secondary | ICD-10-CM | POA: Diagnosis not present

## 2021-10-02 DIAGNOSIS — R509 Fever, unspecified: Secondary | ICD-10-CM | POA: Diagnosis not present

## 2021-10-02 DIAGNOSIS — D611 Drug-induced aplastic anemia: Secondary | ICD-10-CM | POA: Diagnosis not present

## 2021-10-02 DIAGNOSIS — R7881 Bacteremia: Secondary | ICD-10-CM | POA: Diagnosis not present

## 2021-10-02 DIAGNOSIS — D61810 Antineoplastic chemotherapy induced pancytopenia: Secondary | ICD-10-CM | POA: Diagnosis not present

## 2021-10-02 DIAGNOSIS — Z792 Long term (current) use of antibiotics: Secondary | ICD-10-CM | POA: Diagnosis not present

## 2021-10-02 DIAGNOSIS — Z8572 Personal history of non-Hodgkin lymphomas: Secondary | ICD-10-CM | POA: Diagnosis not present

## 2021-10-02 DIAGNOSIS — B49 Unspecified mycosis: Secondary | ICD-10-CM | POA: Diagnosis not present

## 2021-10-02 DIAGNOSIS — Z66 Do not resuscitate: Secondary | ICD-10-CM | POA: Diagnosis not present

## 2021-10-02 DIAGNOSIS — R5081 Fever presenting with conditions classified elsewhere: Secondary | ICD-10-CM | POA: Diagnosis not present

## 2021-10-02 DIAGNOSIS — R0600 Dyspnea, unspecified: Secondary | ICD-10-CM | POA: Diagnosis not present

## 2021-10-02 DIAGNOSIS — C8330 Diffuse large B-cell lymphoma, unspecified site: Secondary | ICD-10-CM | POA: Diagnosis not present

## 2021-10-02 DIAGNOSIS — K6389 Other specified diseases of intestine: Secondary | ICD-10-CM | POA: Diagnosis not present

## 2021-10-02 DIAGNOSIS — I071 Rheumatic tricuspid insufficiency: Secondary | ICD-10-CM | POA: Diagnosis not present

## 2021-10-02 DIAGNOSIS — J811 Chronic pulmonary edema: Secondary | ICD-10-CM | POA: Diagnosis not present

## 2021-10-02 DIAGNOSIS — S139XXA Sprain of joints and ligaments of unspecified parts of neck, initial encounter: Secondary | ICD-10-CM | POA: Diagnosis not present

## 2021-10-02 DIAGNOSIS — C8518 Unspecified B-cell lymphoma, lymph nodes of multiple sites: Secondary | ICD-10-CM | POA: Diagnosis not present

## 2021-10-02 DIAGNOSIS — Z6830 Body mass index (BMI) 30.0-30.9, adult: Secondary | ICD-10-CM | POA: Diagnosis not present

## 2021-10-02 DIAGNOSIS — R911 Solitary pulmonary nodule: Secondary | ICD-10-CM | POA: Diagnosis not present

## 2021-10-02 DIAGNOSIS — C8510 Unspecified B-cell lymphoma, unspecified site: Secondary | ICD-10-CM | POA: Diagnosis not present

## 2021-10-02 DIAGNOSIS — G9341 Metabolic encephalopathy: Secondary | ICD-10-CM | POA: Diagnosis not present

## 2021-10-02 DIAGNOSIS — Z79899 Other long term (current) drug therapy: Secondary | ICD-10-CM | POA: Diagnosis not present

## 2021-10-02 DIAGNOSIS — C8338 Diffuse large B-cell lymphoma, lymph nodes of multiple sites: Secondary | ICD-10-CM | POA: Diagnosis not present

## 2021-10-02 DIAGNOSIS — M79602 Pain in left arm: Secondary | ICD-10-CM | POA: Diagnosis not present

## 2021-10-02 DIAGNOSIS — B957 Other staphylococcus as the cause of diseases classified elsewhere: Secondary | ICD-10-CM | POA: Diagnosis not present

## 2021-10-02 DIAGNOSIS — I509 Heart failure, unspecified: Secondary | ICD-10-CM | POA: Diagnosis not present

## 2021-10-02 DIAGNOSIS — D696 Thrombocytopenia, unspecified: Secondary | ICD-10-CM | POA: Diagnosis not present

## 2021-10-02 DIAGNOSIS — Z221 Carrier of other intestinal infectious diseases: Secondary | ICD-10-CM | POA: Diagnosis not present

## 2021-10-02 DIAGNOSIS — M79601 Pain in right arm: Secondary | ICD-10-CM | POA: Diagnosis not present

## 2021-10-02 DIAGNOSIS — Z515 Encounter for palliative care: Secondary | ICD-10-CM | POA: Diagnosis not present

## 2021-10-02 DIAGNOSIS — R109 Unspecified abdominal pain: Secondary | ICD-10-CM | POA: Diagnosis not present

## 2021-10-02 DIAGNOSIS — D6959 Other secondary thrombocytopenia: Secondary | ICD-10-CM | POA: Diagnosis not present

## 2021-10-02 DIAGNOSIS — R935 Abnormal findings on diagnostic imaging of other abdominal regions, including retroperitoneum: Secondary | ICD-10-CM | POA: Diagnosis not present

## 2021-10-02 DIAGNOSIS — R4701 Aphasia: Secondary | ICD-10-CM | POA: Diagnosis not present

## 2021-10-02 DIAGNOSIS — R609 Edema, unspecified: Secondary | ICD-10-CM | POA: Diagnosis not present

## 2021-10-02 DIAGNOSIS — K2971 Gastritis, unspecified, with bleeding: Secondary | ICD-10-CM | POA: Diagnosis not present

## 2021-10-02 DIAGNOSIS — B9689 Other specified bacterial agents as the cause of diseases classified elsewhere: Secondary | ICD-10-CM | POA: Diagnosis not present

## 2021-10-02 DIAGNOSIS — M799 Soft tissue disorder, unspecified: Secondary | ICD-10-CM | POA: Diagnosis not present

## 2021-10-02 DIAGNOSIS — Z6828 Body mass index (BMI) 28.0-28.9, adult: Secondary | ICD-10-CM | POA: Diagnosis not present

## 2021-10-02 DIAGNOSIS — R188 Other ascites: Secondary | ICD-10-CM | POA: Diagnosis not present

## 2021-10-02 DIAGNOSIS — J984 Other disorders of lung: Secondary | ICD-10-CM | POA: Diagnosis not present

## 2021-10-02 DIAGNOSIS — R918 Other nonspecific abnormal finding of lung field: Secondary | ICD-10-CM | POA: Diagnosis not present

## 2021-10-09 ENCOUNTER — Other Ambulatory Visit: Payer: Self-pay

## 2021-10-09 ENCOUNTER — Telehealth: Payer: Self-pay

## 2021-10-09 NOTE — Patient Outreach (Signed)
[place] [facility]) Care Management  10/09/2021  Mercedes Vasquez Jun 22, 1946 [id number]   Transition of Care Referral   Referral Date: 09/16/2021 Referral Source: Discharge Report Date of Discharge: 09/11/2021 Facility: [facility]   Multiple attempts to establish contact with patient without success. No response from letter mailed to patient. Case is being closed at this time.      Plan: RN CM will close case.  Enzo Montgomery, RN,BSN,CCM [place] Management Telephonic Care Management Coordinator Direct Phone: [phone] Toll Free: [phone] Fax: [phone]

## 2021-10-09 NOTE — Telephone Encounter (Signed)
Message left for patient to get palliative appt scheduled. Daughter cell is out of order.

## 2021-10-22 ENCOUNTER — Ambulatory Visit: Payer: Medicare HMO | Admitting: Nurse Practitioner

## 2021-11-19 ENCOUNTER — Telehealth: Payer: Self-pay

## 2021-11-19 NOTE — Telephone Encounter (Signed)
Attempted to contact patient's daughter Lattie Haw to schedule a Palliative Care consult appointment. No answer and unable to leave a voicemail.

## 2021-11-20 DEATH — deceased

## 2021-12-05 ENCOUNTER — Telehealth: Payer: Self-pay

## 2021-12-05 NOTE — Telephone Encounter (Signed)
Attempted to contact patient's daughter Lattie Haw to reschedule Palliative Care consult appointment. No answer left a message to return call.
